# Patient Record
Sex: Female | Born: 1955
Health system: Southern US, Community
[De-identification: ages and names within clinical notes are randomized; demographics above are authoritative.]

---

## 1998-07-26 ENCOUNTER — Ambulatory Visit (HOSPITAL_COMMUNITY): Admission: RE | Admit: 1998-07-26 | Discharge: 1998-07-26 | Payer: Self-pay | Admitting: *Deleted

## 1998-08-15 ENCOUNTER — Ambulatory Visit (HOSPITAL_COMMUNITY): Admission: RE | Admit: 1998-08-15 | Discharge: 1998-08-15 | Payer: Self-pay | Admitting: *Deleted

## 1998-09-07 ENCOUNTER — Ambulatory Visit (HOSPITAL_COMMUNITY): Admission: RE | Admit: 1998-09-07 | Discharge: 1998-09-07 | Payer: Self-pay | Admitting: *Deleted

## 2000-01-14 ENCOUNTER — Encounter: Payer: Self-pay | Admitting: Emergency Medicine

## 2000-01-14 ENCOUNTER — Emergency Department (HOSPITAL_COMMUNITY): Admission: EM | Admit: 2000-01-14 | Discharge: 2000-01-14 | Payer: Self-pay | Admitting: Emergency Medicine

## 2000-01-16 ENCOUNTER — Encounter: Payer: Self-pay | Admitting: Gastroenterology

## 2000-01-16 ENCOUNTER — Inpatient Hospital Stay (HOSPITAL_COMMUNITY): Admission: EM | Admit: 2000-01-16 | Discharge: 2000-01-21 | Payer: Self-pay | Admitting: Emergency Medicine

## 2000-05-29 ENCOUNTER — Other Ambulatory Visit: Admission: RE | Admit: 2000-05-29 | Discharge: 2000-05-29 | Payer: Self-pay | Admitting: Obstetrics and Gynecology

## 2000-06-05 ENCOUNTER — Encounter: Admission: RE | Admit: 2000-06-05 | Discharge: 2000-06-05 | Payer: Self-pay | Admitting: Obstetrics and Gynecology

## 2000-06-05 ENCOUNTER — Encounter: Payer: Self-pay | Admitting: Obstetrics and Gynecology

## 2001-09-25 ENCOUNTER — Other Ambulatory Visit: Admission: RE | Admit: 2001-09-25 | Discharge: 2001-09-25 | Payer: Self-pay | Admitting: Obstetrics and Gynecology

## 2003-02-09 ENCOUNTER — Other Ambulatory Visit: Admission: RE | Admit: 2003-02-09 | Discharge: 2003-02-09 | Payer: Self-pay | Admitting: Obstetrics and Gynecology

## 2003-10-26 ENCOUNTER — Encounter: Admission: RE | Admit: 2003-10-26 | Discharge: 2003-10-26 | Payer: Self-pay | Admitting: Gastroenterology

## 2004-03-23 ENCOUNTER — Ambulatory Visit (HOSPITAL_COMMUNITY): Admission: RE | Admit: 2004-03-23 | Discharge: 2004-03-23 | Payer: Self-pay | Admitting: Gastroenterology

## 2004-10-19 ENCOUNTER — Other Ambulatory Visit: Admission: RE | Admit: 2004-10-19 | Discharge: 2004-10-19 | Payer: Self-pay | Admitting: Obstetrics and Gynecology

## 2006-04-16 ENCOUNTER — Encounter: Admission: RE | Admit: 2006-04-16 | Discharge: 2006-04-16 | Payer: Self-pay | Admitting: Obstetrics and Gynecology

## 2006-10-26 ENCOUNTER — Emergency Department (HOSPITAL_COMMUNITY): Admission: EM | Admit: 2006-10-26 | Discharge: 2006-10-26 | Payer: Self-pay | Admitting: Emergency Medicine

## 2006-10-27 ENCOUNTER — Inpatient Hospital Stay (HOSPITAL_COMMUNITY): Admission: EM | Admit: 2006-10-27 | Discharge: 2006-10-28 | Payer: Self-pay | Admitting: Emergency Medicine

## 2009-09-20 ENCOUNTER — Ambulatory Visit (HOSPITAL_COMMUNITY): Admission: RE | Admit: 2009-09-20 | Discharge: 2009-09-20 | Payer: Self-pay | Admitting: Gastroenterology

## 2010-02-02 ENCOUNTER — Ambulatory Visit: Payer: Self-pay | Admitting: Vascular Surgery

## 2011-02-23 NOTE — Op Note (Signed)
[facility] Morton. [facility]  Patient:    Kathryn Morton, Kathryn Morton                      MRN: [id number] Proc. Date: 01/16/00 Adm. Date:  [id number] Attending:  Glenna Fellows Morton                           Operative Report  PREOPERATIVE DIAGNOSIS:  Cecal volvulus.  POSTOPERATIVE DIAGNOSIS:  Cecal volvulus.  SURGICAL PROCEDURE:  Right hemicolectomy.  SURGEON:  Kathryn Morton. Hoxworth, M.D.  ASSISTANT:  Kathryn Morton, M.D.  ANESTHESIA:  General.  BRIEF HISTORY:  Kathryn Morton is a 55 year old white female, who presents with two days of severe constant sharp and cramping right mid abdominal pain.  She has had a work-up over the past 36 hours, including a KUB showing a large gas collection n the mid abdomen and then subsequent CT-scan that was felt suspicious for a cecal volvulus.  She underwent colonoscopy with showed a narrowing of the colon in the mid ascending colon, which could not be bypassed.  A gastrograph and enema was hen obtained, which shows a clear cecal volvulus with an abrupt cut off in the mid ascending colon and a large gas bubble in the mid abdomen.  Emergency colectomy has been recommended and accepted.  Kathryn Morton of the procedure, its indications, and risks of bleeding, infection were discussed and understood preoperatively and she is ow brought to the operating room for this procedure.  DESCRIPTION OF PROCEDURE:  The patient was brought to the operating room, placed in the supine position on the operating table and general endotracheal anesthesia as induced.  She had received broad-spectrum antibiotics intravenously.  Foley catheter and nasogastric tube were placed and the abdomen was sterilely prepped and draped.  The abdomen was explored through a midline incision skirting the umbilicus.  On entering the peritoneal cavity, there was some serous fluid that was suctioned. The cecum was massively dilated and congested, but there was  no perforation or necrosis.  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  The cecum measured approximately 15 cm in diameter.  The white line of Toldt along the right colon was incised and using blunt and cautery dissection, the right colon and cecum were delivered up out of the retroperitoneum freeing he obstruction.  We then elected to proceed with resection of the right colon, which was quite redundant.  A point in the proximal transverse colon was chosen with ood blood supply and this area was cleaned of omentum and pericolic fat and mesentery. The mesentery beginning at the extreme terminal ileum was divided working distally along the cecum and right colon.  Large vessels were doubly ligated with 2-0 silk. Therefore, the mesentery was completely divided.  A functional end-to-end anastomosis was created between the terminal ileum and the proximal transverse colon with a single firing of the GIA stapler.  The common enterotomy was closed and the specimen removed with a firing of the TA-60 stapler.  The mesenteric defect was closed with interrupted 3-0 silk sutures.  The anastomosis was widely patent and under no tension.  The abdomen was irrigated with saline and inspected for hemostasis, which was complete.  The remainder of the exploration, including uterus, ovaries, retroperitoneum, small bowel, liver, stomach  and duodenum were all unremarkable.  The fascia was closed with running #1 PDS beginning at the each nd of the incision and tied centrally.  The subcutaneous tissue was irrigated and kin closed with staples.  Sponge, needle and instrument counts were correct.  Dry sterile dressings were applied and the patient taken to recovery room in good condition. DD:  01/16/00 TD:   01/17/00 Job: [id number]

## 2011-02-23 NOTE — Op Note (Signed)
NAME:  Kathryn Morton, Kathryn Morton                         ACCOUNT NO.:  [id number]   MEDICAL RECORD NO.:  [id number]                   PATIENT TYPE:  AMB   LOCATION:  ENDO                                 FACILITY:  [facility]   PHYSICIAN:  Petra Kuba, M.D.                 DATE OF BIRTH:  January 19, 1956   DATE OF PROCEDURE:  03/23/2004  DATE OF DISCHARGE:                                 OPERATIVE REPORT   PROCEDURE:  Colonoscopy.   INDICATIONS:  Patient with multiple GI symptoms and normal x-ray; almost due  for colonic screening.  Consent was signed after risks, benefits, methods  and options were thoroughly discussed in the office.   MEDICATIONS USED:  1. Demerol 100 mg .  2. Versed 10 mg.   DESCRIPTION OF PROCEDURE:  Rectal inspection is pertinent for small external  hemorrhoids.  Digital examination was negative.  Video pediatric adjustable  colonoscope was inserted, and with some difficulty due to a tortuous colon.  which required rolling her onto her back and her right side.  Once we were  able to get the loop out we were then easily able to advance to the  anastomosis and a short way into the small bowel.  She did have a colon and  a small bowel side anastomosis.  No abnormality were seen on insertion.  The  prep was adequate.  There was some liquid stool that required washing and  suctioning.  On slow withdrawal back to the rectum, no abnormalities were  seen.  Anal and rectal pullthrough in retroflexion confirmed some small  hemorrhoids.   The scope was reinserted a short way up the left side of the colon.  Air was  suctioned and scope removed.  The patient tolerated the procedure well.  There was no obvious immediate complication.   ENDOSCOPIC DIAGNOSES:  1. Internal/external hemorrhoids.  2. Colon and the small bowel site anastomosis.  3. Otherwise within normal limits to the terminal ileum.   PLAN:  Repeat screening in five years.  Might even consider a virtual  colonoscopy if  widely available, secondary to tortuosity.  Otherwise  continue on with EGD.                                               Petra Kuba, M.D.    MEM/MEDQ  D:  03/23/2004  T:  03/24/2004  Job:  [id number]   cc:   Thora Lance, M.D.  [address]  [place]  [place] [address]  Fax: [phone]

## 2011-02-23 NOTE — Discharge Summary (Signed)
[facility]  Patient:    Kathryn Morton, Kathryn Morton                      MRN: [id number] Adm. Date:  [id number] Disc. Date: [id number] Attending:  Delsa Bern CC:         Llana Aliment. Randa Evens, M.D.             Alvester Morin, M.D.                           Discharge Summary  OPERATIONS/PROCEDURES: Colonoscopy on 01/16/00 by Dr. Vilinda Boehringer. Right hemicolectomy on 01/16/00 by Dr. Glenna Fellows.  DISCHARGE DIAGNOSIS:  Cecal volvulus.  HISTORY OF PRESENT ILLNESS:  Yeraldine Forney is a 55 year old white female with a  long history of occasional crampy abdominal pain who has been managed for irritable bowel syndrome.  Her symptoms have been gradually worsening and approximately one week ago, she had a much more severe episode of acute right midabdominal pain that resolved.  She then presents this admission with a two-day history of constant,  squeezing cramping right-sided and midabdominal pain.  She is admitted through he emergency department for further evaluation due to persistent pain.  PAST MEDICAL HISTORY:  She carries a diagnosis of IBS.  PAST SURGICAL HISTORY:  She is status post a cholecystectomy, appendectomy and tonsillectomy.  MEDICATIONS:  Levsin p.r.n.  ALLERGIES:  No known drug allergies.  SOCIAL HISTORY:  She is an OR Engineer, civil (consulting) at [employer].  She is married, without children.  Drinks rarely socially and does not smoke cigarettes.  PERTINENT PHYSICAL EXAMINATION:  GENERAL:  She is a thin, healthy-appearing white female.  VITAL SIGNS:  Temperature 99.4, pulse 80, respirations 18 and blood pressure 134/83.  Pertinent findings were limited to the abdomen, which reveals mild distention and moderate tenderness in the right midabdomen and right lower quadrant.  Stool was heme negative.  ADMISSION LABORATORY:  White count 4.3 and hemoglobin 10.4.  Electrolytes and LFTs - normal.   KUB was obtained, which revealed  a large central gas bubble, possibly in the transverse colon.  CT scan of the abdomen and pelvis confirmed this, which was elt possibly to be right colon and there was a small (probably insignificant left ovarian cyst).  On 01/15/00, she felt somewhat better, but was still having some persistent abdominal discomfort.  Dr. Randa Evens was consulted.  She initially was felt to have constipation secondary to IBS.  However, on 01/15/00, the patient developed again severe right-sided abdominal pain, unresponsive to narcotics.  This came on shortly after taking Miralax.  Her pain persisted into 01/16/00 with severe constant pain.  Her x-rays were reviewed by the teaching service and Dr. Randa Evens, and the concern was raised about a possible volvulus of the right colon.  The patient underwent  urgent colonoscopy by Dr. Randa Evens with findings of abrupt narrowing of the ascending colon, consistent with cecal volvulus.  An urgent Gastrografin enema was obtained immediately following this, which was  diagnostic for a cecal volvulus with near total obstruction of her ascending colon and a large central gas bubble representing the cecum.  I was consulted at this point and with persistent pain and tenderness and these  findings, urgent right colectomy was recommended.  The patient was taken to the  operating room on 01/16/00 and findings, indeed, did reveal a cecal volvulus with a markedly dilated and congested cecum.  She  underwent a right colectomy without incident.  Her postoperative course was relatively smooth.  The Foley catheter was discontinued on the second day, and she was started on ice chips at this point.  She had positive bowel sounds on 01/19/00, and was begun on sips of liquids. She had a bowel movement by 01/20/00, and was advanced to a regular diet.  DISPOSITION:  She is felt ready for discharge on the fifth postoperative day on  01/21/00.  Her wound is healing primarily,  and staples were removed.  She is tolerating a diet.  She is up and around with moderate discomfort.  DISCHARGE INSTRUCTIONS:  Activity limitation was reviewed prior to discharge. Followup is to be in my office in 10-14 days. DD:  02/08/00 TD:  02/08/00 Job: [id number]

## 2011-02-23 NOTE — Consult Note (Signed)
[facility]  Patient:    Kathryn Morton, Kathryn Morton                      MRN: [id number] Proc. Date: 01/15/00 Adm. Date:  [id number] Attending:  Phifer, Harriett Sine Welcome CC:         Alvester Morin, M.D.                          Consultation Report  REASON FOR CONSULTATION:  Abdominal pain.  HISTORY:  A 55 year old white female who has a history of irritable bowel that as a clinical diagnosis given to her a number of years ago.  She has persistent abdominal pain and constipation and often goes up to a week without a bowel movement.  She is taking Levsin, which she has obtained from an anesthesiologist in the OR.  Her abdominal pain started about 9 p.m. Saturday after dinner and became so severe she came to the emergency room.  CT of the abdomen showed an ovarian cyst, an enormous amount of stool, a dilated transverse colon.  She was given Percocet.  Labs remarkable for normal H&H and electrolytes.  The patient had a cholecystectomy.  She came in with this severe pain and again the CT showed colon full of stool. She has gotten enemas in the hospital and has had some relief with the enemas.  She has continued to be afebrile.  MEDICATIONS ON ADMISSION:  Levsin p.r.n.  ALLERGIES:  She has no drug reactions.  PAST MEDICAL HISTORY:  History of irritable bowel.  History of previous appendicitis.  Previous cholecystectomy.  FAMILY HISTORY:  Remarkable for colon cancer in her uncle, colon polyps in both  parents and a sister.  SOCIAL HISTORY:  She is married and lives with her husband.  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  She drinks wine and smokes cigars occasionally.  PHYSICAL EXAMINATION:  VITAL SIGNS:  The patient is afebrile.  GENERAL:  Thin, white female in no acute distress.  HEENT:  Sclerae non-icteric.  HEART:  Regular rate and rhythm without murmurs or gallops.  ABDOMEN:  Soft with mild tenderness in the left  lower quadrant.  ASSESSMENT: 1. Abdominal pain--this is probably due to severe constipation.  Although other    possibilities exist, with a CT scan showing an enormous amount of stool this is    the most likely cause and I think we need to go and get her colon cleared out.    Since she does have a strong family history of colon cancer and polyps, I think    a colonoscopy would be more appropriate than a barium enema.  We will work on    this to get done at a later date. 2. Irritable bowel syndrome by history. 3. Status post cholecystectomy and appendectomy.  PLAN:  Plan on starting Miralax 3-4 glasses daily.  Continue with the enemas and, as soon as she begins to have bowel movements and tolerate solid food, I think e can discharge her and follow up further as an outpatient. DD:  01/15/00 TD:  01/15/00 Job: [id number]

## 2011-02-23 NOTE — Op Note (Signed)
NAMETANGANYIKA, BOWLDS NO.:  [id number]   MEDICAL RECORD NO.:  [id number]          PATIENT TYPE:  INP   LOCATION:  2550                         FACILITY:  [facility]   PHYSICIAN:  Dionne Ano. Gramig III, M.D.DATE OF BIRTH:  01-13-1956   DATE OF PROCEDURE:  10/27/2006  DATE OF DISCHARGE:                               OPERATIVE REPORT   PREOPERATIVE DIAGNOSIS:  Complex comminuted interarticular left distal  radius fracture, greater than five pieces with associated carpal tunnel  syndrome and swelling.   POSTOPERATIVE DIAGNOSIS:  Complex comminuted interarticular left distal  radius fracture, greater than five pieces with associated carpal tunnel  syndrome and swelling.   PROCEDURE:  1. Open reduction internal fixation left comminuted complex      interarticular distal radius fracture with DVR plate and screw      construct from [employer] and allograft bone grafting in the      form of Grafton bone graft and cancellous bone chips.  2. Stress radiography.  3. Fasciotomy, left forearm.  4. Carpal tunnel release, open in nature, left upper extremity.   SURGEON:  Dionne Ano. Amanda Pea, M.D.   ASSISTANT:  Karie Chimera, PA   TOURNIQUET TIME:  Less than 90 minutes.   DRAINS:  One.   ESTIMATED BLOOD LOSS:  Less than 50 mL.   COMPLICATIONS:  None.   INDICATIONS FOR PROCEDURE:  Ms. Vandenbos is a 55 year old female presents  with the above-mentioned diagnosis.  I have counseled her in regard to  risks and benefits of surgery including risk of infection, bleeding,  anesthesia, damage to normal structures and failure of surgery to  accomplish its intended goals of relieving symptoms and restoring  function.  With this mind, she desires to proceed.  All questions have  been encouraged and answered preoperatively.   The patient has a large degree of comminution.  She has acute carpal  tunnel symptoms and has had prior carpal tunnel symptoms in the past.  I  have  discussed her the options for treatment etc. and she desires to  proceed the above-mentioned surgical endeavors.  We have extensively  counseled her and performed a preliminary reduction when she presented  last night, October 26, 2006.   OPERATIVE PROCEDURE IN DETAIL:  The patient was seen by myself and  anesthesia.  Extremity to be operated on was marked.  She was given  preoperative Ancef, consented and taken to the operative suite where she  underwent smooth induction of general anesthesia.  Following this, the  patient was laid supine, appropriately padded, prepped and draped in  usual sterile fashion with Betadine scrub and paint about the left upper  extremity.  I held the arm very carefully to avoid manipulation during  this.  In addition to this, the patient was carefully draped after prep  was completed and had finger trap traction applied.  Once this was done,  the tourniquet was insufflated to 250 mmHg and an incision was made  volar radially about the forearm.  Once the incision was made, I then  dissected down, released the FCR tendon sheath both  palmar and distally  and once this was done, slid scissor tips to release the superficial and  then deep fascia.  She has significant muscle bulge and swelling, but no  evidence of necrotic muscle.  Once this was done, I then carefully  placed a retractor and elevated the carpal contents ulnarly.  The  pronator was incised in L-shaped fashion, fracture site was then  accessed.  Once the fasciotomy was performed and fracture was accessed,  attention was turned towards the broken radius which was highly  comminuted, complex, greater than five parts with the bone defect  secondary to metaphyseal bone loss.   The patient had a combination of Grafton and cancellous bone chips  placed in the defect.  I very carefully reassembled the articular  surface paying special attention to the lunate facette.  This was done  to my satisfaction  without difficulty and large amount of bone graft was  placed in the metaphyseal void.  This void was noted preoperatively and  discussed with the patient of course.  Following bone grafting, the  patient then had placement of a DVR plate and screw construct.  This was  applied in standard AO fashion.  The patient had excellent restoration  of her anatomy.  Radial inclination, height and volar tilt looked  excellent.  The plate and screw construct sat nicely without  complicating feature.   Once this was done, I then performed copious irrigation followed by  stress radiography.  Stress radiography was performed throughout the  case to verify correct position of the pegs and screw construct for the  DVR plate.  Anatomic reduction was noted and I was quite pleased with  it, given her degree of comminution.  Once this was complete, I then  repaired the pronator quadratus with 3-0 Vicryl, it covered the plate  beautifully.  It was taken down meticulously during the initial  dissection so as to cover the plate of course.   My interpretation of the x-rays revealed excellent position and no  hardware prominence.  All looked quite well on fluoroscopy views taken  in the OR.   Following this, I then turned attention towards the carpal canal.  The  patient demonstrated carpal tunnel symptoms preop.  Thus a incision 1  inch in nature was made at the distal edge of the transverse carpal  ligament in line with the radial border of the ring finger.  Dissection  was carried down, skin was retracted of harm's way palmar fascia was  incised.  Following this, the distal edge transverse carpal ligament was  accessed, released and the superficial palmar arch was carefully  protected all times.  I released this tissue without difficulty and  following this, performed distal to proximal dissection until room was available for sliding the scissors tips under direct vision to release  the proximal leaflet  and portions of the antebrachial fascia.  This done  without difficulty.  Following this, the tourniquet was deflated.  Hemostasis obtained with bipolar electrocautery and the patient then  underwent copious irrigation.  The carpal tunnel release site was closed  with Prolene.  The volar radial wound was closed with a Vicryl, 3-0  variety followed by Prolene stitches.  She tolerated this well and there  were no complicating features.  TLS drain was placed to allow for the  egress of fluid.  The patient 10 mL of Marcaine without epinephrine  0.25% and 10 mL of Marcaine 0.25% with epinephrine placed in the wound  for postop analgesia.  She tolerated this well and there were no  complicating features.  Once this done, Xeroform followed by Neosporin,  Adaptic gauze and a volar plaster splint was placed without difficulty.  She tolerated this well.  Once this done, she was extubated, taken to  recovery room,  will be monitored, admitted for IV antibiotics general postop measures  etc. I have discussed with her and her family do's and don't's, pre and  postop routines etc.  This was a high comminuted fracture and we will  watch it closely in the postoperative period.           ______________________________  Dionne Ano. Everlene Other, M.D.     Nash Mantis  D:  10/27/2006  T:  10/27/2006  Job:  [id number]

## 2011-02-23 NOTE — Op Note (Signed)
NAMEESTI, DEMELLO NO.:  [id number]   MEDICAL RECORD NO.:  [id number]          PATIENT TYPE:  INP   LOCATION:  2550                         FACILITY:  [facility]   PHYSICIAN:  Dionne Ano. Gramig III, M.D.DATE OF BIRTH:  April 26, 1956   DATE OF PROCEDURE:  10/27/2006  DATE OF DISCHARGE:                               OPERATIVE REPORT   Kathryn Morton was seen.  Full H&P performed, and exam concluded October 26, 2006.   Kathryn Morton has a significant distal radius fracture, comminuted complex  interarticular, greater than five parts.  She presented with an acutely  displaced fracture.  She underwent hematoma block and administration of  2 mg of morphine and 2 mg of Valium IV.  Following this she underwent  closed manipulation restoring parameters.   Postreduction x-rays looked excellent.  She tolerated the closed  reduction quite well.  Given her large defect/metaphyseal void and  propensity towards collapse, we are going to plan for ORIF in the future  with bone grafting, tendon release and concomitant carpal tunnel  release, given her carpal tunnel symptoms and preop examination  findings.  I have discussed with Kathryn Morton these issues and the risks and  benefits of surgery including risk of infection, bleeding, anesthesia,  damage to normal structures and failure of surgery to accomplish its  intended goals of relieving symptoms and restoring function.  With this  in mind, she desires to proceed.  We will proceed __________ time.  She  left the emergency room stable on Percocet, Robaxin and Peri-Colace per  my instruction.  All questions have been encouraged and answered and  addressed.           ______________________________  Dionne Ano. Everlene Other, M.D.     Nash Mantis  D:  10/27/2006  T:  10/27/2006  Job:  [id number]

## 2011-02-23 NOTE — Procedures (Signed)
[facility]  Patient:    Kathryn Morton, Kathryn Morton                      MRN: [id number] Proc. Date: 01/07/00 Adm. Date:  [id number] Attending:  Delsa Bern CC:         Alvester Morin, M.D.             Lorne Skeens. Hoxworth, M.D.                           Procedure Report  PROCEDURE PERFORMED:  Colonoscopy.  ENDOSCOPIST:  Llana Aliment. Randa Evens, M.D.  MEDICATIONS USED:  Fentanyl 75 mcg, Versed 7 mg IV.  INSTRUMENT:  Pediatric video colonoscope.  INDICATIONS:  Patient is a 55 year old woman who does have a family history of colon polyps who came with abdominal pain and "constipation."  CT showed dilated colon, a lot of fecal type material.  She has not responded to enemas or laxatives.  In fact, it has become worse with increasing abdominal pain. She has had bouts of abdominal pain for some time treated with p.r.n. Levsin and n.p.o. status which would eventually resolve it.  This was a similar episode, only much worse.  Review of a set of acute abdominal films today and the previous CT with a radiologist led to a probable diagnosis of a cecal volvulus.  The colon was quite dilated.  She was having increasing distention this morning.  In view of this, a colonoscopy was performed to try to get a better handle on the diagnosis and to decompress the colon.  DESCRIPTION OF PROCEDURE:  The procedure had been explained to the patient and consent obtained.  With the patient in the left lateral decubitus position, the Olympus pediatric video colonoscope was inserted and advanced under direct visualization.  The patient had a very clean colon after all the enemas and laxatives that she had.  In fact, there was very little solid stool.  We were able to reach what appeared to be the transverse colon.  It was a narrowed area.  With gentle pressure, we entered into this and there was immediate decompression of her belly.  There was a large dilated colon on the  other side.  There was a fair amount of liquid stool.  All this was suctioned out and decompressed.  We advanced down into the right lower quadrant, right mid colon which was felt to be in the ascending colon and bird beak type area was encountered.  I was unable to pass this.  Had the impression of obstruction somewhere in the ascending colon.  I was afraid to pass the scope through not really knowing what was on the other side.  The scope was withdrawn and decompressed completely upon withdrawal.  The patient tolerated this very well and her abdomen was much less distended.  The patient was resting comfortably at the termination of the procedure.  ASSESSMENT:  Apparent blockage of the ascending colon.  I think this is all consistent with a cecal volvulus.  The entire colon has been decompressed up to this point.  PLAN:  Will move ahead with a Gastrografin enema to try to define the anatomy and had asked Dr. Johna Sheriff to see her in consultation. DD:  01/16/00 TD:  01/17/00 Job: [id number]

## 2011-02-23 NOTE — Op Note (Signed)
NAME:  Kathryn Morton, Kathryn Morton                         ACCOUNT NO.:  [id number]   MEDICAL RECORD NO.:  [id number]                   PATIENT TYPE:  AMB   LOCATION:  ENDO                                 FACILITY:  [facility]   PHYSICIAN:  Petra Kuba, M.D.                 DATE OF BIRTH:  1956/04/08   DATE OF PROCEDURE:  03/23/2004  DATE OF DISCHARGE:                                 OPERATIVE REPORT   PROCEDURE:  EGD .   INDICATIONS:  Upper tract symptoms.  Abnormal upper GI.  Consent was signed  after risks, benefits, methods and options were thoroughly discussed in the  office.   MEDICATIONS USED:  No additional medications were used for this procedure.   DESCRIPTION OF PROCEDURE:  The video endoscope was inserted by direct  vision.  The esophagus, proximal and mid esophagus was normal.  In the  distal esophagus there was a small hiatal hernia, with a widely patent ring  and some minimal distal inflammation.  The scope passed easily into the  stomach and advanced through normal antrum, normal pylorus into a normal  duodenal bulb; around the celiac to a normal second portion of the duodenum.  The scope was withdrawn back to the bulb and a good look there ruled out  abnormalities and mallocutions.  The stomach was evaluated on retroflex and  with straight visualization, with a good look at the cardia, fundus,  annularis, lesser and greater curve.  On retroflexion the hiatal hernia was  confirmed, but no other abnormality was seen on straight and retroflexed  visualization.  The air was suctioned and the scope was slowly withdrawn  again. A good look at the hiatal hernia pouch and the esophagus confirmed  the above findings.  The scope was removed.  The patient tolerated the  procedure well and there was no evidence of any complications.   ENDOSCOPIC DIAGNOSES:  1. Small hiatal hernia with a widely patent ring and minimal distal     inflammation.  2. Otherwise normal EGD.   PLAN:  Trial of  pump inhibitors for two months, and then follow-up.  Happy  to see back sooner p.r.n. Will write prescription for Nexium.                                               Petra Kuba, M.D.    MEM/MEDQ  D:  03/23/2004  T:  03/24/2004  Job:  [id number]   cc:   Thora Lance, M.D.  [address]  [place]  [place] [address]  Fax: [phone]

## 2012-07-16 ENCOUNTER — Ambulatory Visit
Admission: RE | Admit: 2012-07-16 | Discharge: 2012-07-16 | Disposition: A | Discharge status: Home / Self Care | Payer: 59 | Source: Ambulatory Visit | Attending: Gastroenterology | Admitting: Gastroenterology

## 2012-07-16 ENCOUNTER — Other Ambulatory Visit: Payer: Self-pay | Admitting: Gastroenterology

## 2012-07-16 DIAGNOSIS — R05 Cough: Secondary | ICD-10-CM

## 2012-07-16 DIAGNOSIS — R059 Cough, unspecified: Secondary | ICD-10-CM

## 2015-10-25 MED FILL — CITALOPRAM HBR 10 MG TABLET: 10 | 30 days supply | Qty: 30 | Fill #1

## 2015-11-30 DIAGNOSIS — G5762 Lesion of plantar nerve, left lower limb: Secondary | ICD-10-CM | POA: Diagnosis not present

## 2015-11-30 DIAGNOSIS — G5761 Lesion of plantar nerve, right lower limb: Secondary | ICD-10-CM | POA: Diagnosis not present

## 2015-12-05 MED FILL — CITALOPRAM HBR 10 MG TABLET: 10 | 30 days supply | Qty: 30 | Fill #0

## 2016-01-09 MED FILL — CITALOPRAM HBR 10 MG TABLET: 10 | 30 days supply | Qty: 30 | Fill #1

## 2016-01-30 MED FILL — PANTOPRAZOLE SOD DR 40 MG T: 40 | 90 days supply | Qty: 180 | Fill #1

## 2016-02-08 MED FILL — CITALOPRAM HBR 10 MG TABLET: 10 | 30 days supply | Qty: 30 | Fill #2

## 2016-02-09 MED FILL — ATOVAQUONE-PROGUANIL 250-10: 250-100 | 17 days supply | Qty: 17 | Fill #0

## 2016-02-09 MED FILL — ZOLPIDEM TARTRATE 10 MG TAB: 10 | 15 days supply | Qty: 15 | Fill #0

## 2016-02-09 MED FILL — ONDANSETRON HCL 4 MG TABLET: 4 | 4 days supply | Qty: 15 | Fill #0

## 2016-02-09 MED FILL — CIPROFLOXACIN HCL 500 MG TA: 500 | 3 days supply | Qty: 6 | Fill #0

## 2016-02-10 MED FILL — OSCIMIN SL 0.125 MG TABLET: 0.125 | 8 days supply | Qty: 100 | Fill #0

## 2016-03-16 MED FILL — CITALOPRAM HBR 10 MG TABLET: 10 | 30 days supply | Qty: 30 | Fill #0

## 2016-05-24 DIAGNOSIS — Z1231 Encounter for screening mammogram for malignant neoplasm of breast: Secondary | ICD-10-CM | POA: Diagnosis not present

## 2016-05-24 DIAGNOSIS — Z13820 Encounter for screening for osteoporosis: Secondary | ICD-10-CM | POA: Diagnosis not present

## 2016-05-24 DIAGNOSIS — Z01419 Encounter for gynecological examination (general) (routine) without abnormal findings: Secondary | ICD-10-CM | POA: Diagnosis not present

## 2016-05-24 DIAGNOSIS — Z6823 Body mass index (BMI) 23.0-23.9, adult: Secondary | ICD-10-CM | POA: Diagnosis not present

## 2016-05-25 ENCOUNTER — Other Ambulatory Visit (HOSPITAL_COMMUNITY): Payer: Self-pay | Admitting: Obstetrics and Gynecology

## 2016-05-25 ENCOUNTER — Ambulatory Visit (HOSPITAL_COMMUNITY)
Admission: RE | Admit: 2016-05-25 | Discharge: 2016-05-25 | Disposition: A | Discharge status: Home / Self Care | Payer: 59 | Source: Ambulatory Visit | Attending: Obstetrics and Gynecology | Admitting: Obstetrics and Gynecology

## 2016-05-25 DIAGNOSIS — M79661 Pain in right lower leg: Secondary | ICD-10-CM | POA: Insufficient documentation

## 2016-05-25 DIAGNOSIS — R52 Pain, unspecified: Secondary | ICD-10-CM

## 2016-05-25 NOTE — Progress Notes (Signed)
*  Preliminary Results* Right lower extremity venous duplex completed. Right lower extremity is negative for deep vein thrombosis. There is no evidence of right Baker's cyst.  05/25/2016 9:54 AM  Gertie FeyMichelle Keyry Iracheta, BS, RVT, RDCS, RDMS

## 2016-07-10 MED FILL — CITALOPRAM HBR 10 MG TABLET: 10 | 90 days supply | Qty: 90 | Fill #0

## 2016-08-15 MED FILL — PANTOPRAZOLE SOD DR 40 MG T: 40 | 30 days supply | Qty: 30 | Fill #0

## 2016-09-14 DIAGNOSIS — M62830 Muscle spasm of back: Secondary | ICD-10-CM | POA: Diagnosis not present

## 2016-09-14 DIAGNOSIS — K219 Gastro-esophageal reflux disease without esophagitis: Secondary | ICD-10-CM | POA: Diagnosis not present

## 2016-09-14 MED FILL — METHOCARBAMOL 500 MG TABLET: 500 | 5 days supply | Qty: 30 | Fill #0

## 2016-09-14 MED FILL — PANTOPRAZOLE SOD DR 40 MG T: 40 | 90 days supply | Qty: 90 | Fill #0

## 2016-09-27 ENCOUNTER — Encounter (INDEPENDENT_AMBULATORY_CARE_PROVIDER_SITE_OTHER): Payer: Self-pay | Admitting: Orthopedic Surgery

## 2016-09-27 ENCOUNTER — Ambulatory Visit (INDEPENDENT_AMBULATORY_CARE_PROVIDER_SITE_OTHER): Payer: Self-pay | Admitting: Orthopedic Surgery

## 2016-09-27 ENCOUNTER — Ambulatory Visit (INDEPENDENT_AMBULATORY_CARE_PROVIDER_SITE_OTHER): Payer: 59 | Admitting: Orthopedic Surgery

## 2016-09-27 VITALS — Ht 64.0 in | Wt 135.0 lb

## 2016-09-27 DIAGNOSIS — G5762 Lesion of plantar nerve, left lower limb: Secondary | ICD-10-CM

## 2016-09-27 DIAGNOSIS — G5761 Lesion of plantar nerve, right lower limb: Secondary | ICD-10-CM | POA: Diagnosis not present

## 2016-09-27 MED ORDER — METHYLPREDNISOLONE ACETATE 40 MG/ML IJ SUSP
40.0000 mg | INTRAMUSCULAR | Status: AC | PRN
  Administered 2016-09-27: 40 mg via INTRA_ARTICULAR

## 2016-09-27 MED ORDER — LIDOCAINE HCL 1 % IJ SOLN
1.0000 mL | INTRAMUSCULAR | Status: AC | PRN
  Administered 2016-09-27: 1 mL

## 2016-09-27 NOTE — Progress Notes (Signed)
Office Visit Note   Patient: Kathryn Morton           Date of Birth: 10/11/1955           MRN: [id number] Visit Date: 09/27/2016              Requested by: No referring provider defined for this encounter. PCP: Lillia MountainGRIFFIN,JOHN JOSEPH, MD   Assessment & Plan: Visit Diagnoses:  1. Morton's metatarsalgia, neuralgia, or neuroma, left   2. Morton's metatarsalgia, neuralgia, or neuroma, right     Plan: Morton's neuroma injected bilaterally she tolerated this well follow-up as needed.  Follow-Up Instructions: Return if symptoms worsen or fail to improve.   Orders:  No orders of the defined types were placed in this encounter.  No orders of the defined types were placed in this encounter.     Procedures: Small Joint Inj Date/Time: 09/27/2016 3:18 PM Performed by: DUDA, MARCUS V Authorized by: Nadara MustardUDA, MARCUS V   Consent Given by:  Patient Site marked: the procedure site was marked   Timeout: prior to procedure the correct patient, procedure, and site was verified   Indications:  Pain and diagnostic evaluation Location:  Foot Site:  R intertarsal Prep: patient was prepped and draped in usual sterile fashion   Needle Size:  22 G Spinal Needle: No   Approach:  Dorsal Ultrasound Guided: No   Fluoroscopic Guidance: No   Medications:  1 mL lidocaine 1 %; 40 mg methylPREDNISolone acetate 40 MG/ML Aspiration Attempted: No   Patient tolerance:  Patient tolerated the procedure well with no immediate complications Small Joint Inj Date/Time: 09/27/2016 3:19 PM Performed by: DUDA, MARCUS V Authorized by: Nadara MustardUDA, MARCUS V   Consent Given by:  Patient Site marked: the procedure site was marked   Timeout: prior to procedure the correct patient, procedure, and site was verified   Indications:  Pain and diagnostic evaluation Location:  Foot Site:  L intertarsal Prep: patient was prepped and draped in usual sterile fashion   Needle Size:  22 G Spinal Needle: No   Approach:   Dorsal Ultrasound Guided: No   Fluoroscopic Guidance: No   Medications:  1 mL lidocaine 1 %; 40 mg methylPREDNISolone acetate 40 MG/ML Aspiration Attempted: No   Patient tolerance:  Patient tolerated the procedure well with no immediate complications     Clinical Data: No additional findings.   Subjective: Chief Complaint  Patient presents with  . Left Foot - Pain  . Right Foot - Pain    Pt had bilateral morton's neuroma injections 11/30/15. In the office today wanting to have repeat injections. They worked well for her but the pain has returned and would like to have this done today.    Review of Systems   Objective: Vital Signs: Ht 5\' 4"  (1.626 m)   Wt 135 lb (61.2 kg)   BMI 23.17 kg/m   Physical Exam on examination patient is alert oriented no adenopathy well-dressed normal affect normal rest or effort she has a normal gait. She has good pulses bilaterally. There is a wider space between the third and fourth toes bilaterally she has a palpable clunk with lateral compression of the metatarsal heads palpation in the third webspace reproduces her burning pain.  Ortho Exam  Specialty Comments:  No specialty comments available.  Imaging: No results found.   PMFS History: Patient Active Problem List   Diagnosis Date Noted  . Morton's metatarsalgia, neuralgia, or neuroma, left 09/27/2016  . Morton's metatarsalgia, neuralgia, or  neuroma, right 09/27/2016   No past medical history on file.  No family history on file.  No past surgical history on file. Social History   Occupational History  . Not on file.   Social History Main Topics  . Smoking status: Not on file  . Smokeless tobacco: Never Used  . Alcohol use Not on file  . Drug use: Unknown  . Sexual activity: Not on file

## 2016-10-16 MED FILL — CITALOPRAM HBR 10 MG TABLET: 10 | 90 days supply | Qty: 90 | Fill #1

## 2016-11-22 MED FILL — CITALOPRAM HBR 20 MG TABLET: 20 | 90 days supply | Qty: 90 | Fill #0

## 2016-12-19 MED FILL — PANTOPRAZOLE SOD DR 40 MG T: 40 | 90 days supply | Qty: 90 | Fill #1

## 2017-03-18 MED FILL — CITALOPRAM HBR 20 MG TABLET: 20 | 90 days supply | Qty: 90 | Fill #1

## 2017-03-18 MED FILL — PANTOPRAZOLE SOD DR 40 MG T: 40 | 90 days supply | Qty: 90 | Fill #2

## 2017-05-29 DIAGNOSIS — Z1231 Encounter for screening mammogram for malignant neoplasm of breast: Secondary | ICD-10-CM | POA: Diagnosis not present

## 2017-05-29 DIAGNOSIS — Z6823 Body mass index (BMI) 23.0-23.9, adult: Secondary | ICD-10-CM | POA: Diagnosis not present

## 2017-05-29 DIAGNOSIS — Z01419 Encounter for gynecological examination (general) (routine) without abnormal findings: Secondary | ICD-10-CM | POA: Diagnosis not present

## 2017-06-17 MED FILL — CITALOPRAM HBR 20 MG TABLET: 20 | 90 days supply | Qty: 90 | Fill #2

## 2017-06-17 MED FILL — PANTOPRAZOLE SOD DR 40 MG T: 40 | 90 days supply | Qty: 90 | Fill #3

## 2017-08-08 ENCOUNTER — Ambulatory Visit (INDEPENDENT_AMBULATORY_CARE_PROVIDER_SITE_OTHER): Payer: 59 | Admitting: Orthopedic Surgery

## 2017-08-08 ENCOUNTER — Encounter (INDEPENDENT_AMBULATORY_CARE_PROVIDER_SITE_OTHER): Payer: Self-pay | Admitting: Orthopedic Surgery

## 2017-08-08 VITALS — Ht 64.0 in | Wt 135.0 lb

## 2017-08-08 DIAGNOSIS — M722 Plantar fascial fibromatosis: Secondary | ICD-10-CM | POA: Diagnosis not present

## 2017-08-08 DIAGNOSIS — G5762 Lesion of plantar nerve, left lower limb: Secondary | ICD-10-CM

## 2017-08-08 NOTE — Progress Notes (Signed)
   Office Visit Note   Patient: Kathryn Morton           Date of Birth: [birth year]           MRN: [id number] Visit Date: 08/08/2017              Requested by: Kirby FunkGriffin, John, MD [address] [place], [place] [address] PCP: Kirby FunkGriffin, John, MD  Chief Complaint  Patient presents with  . Left Foot - Follow-up    Bilateral morton's neuroma injections 11/30/15  . 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  She states she has been taking Motrin once a day.  Assessment & Plan: Visit Diagnoses:  1. Morton's metatarsalgia, neuralgia, or neuroma, left   2. Plantar fasciitis, left     Plan: Discussed that she can take ibuprofen up to 600 mg 3 times a day with meals.  Recommended a stiff soled Trail running sneaker with sole orthotics.  Discussed that the arch support will spread the metatarsal heads and further unload pressure on the Morton's neuroma.  She was given instructions for heel cord stretching for her plantar fasciitis.  Follow-Up Instructions: Return if symptoms worsen or fail to improve.   Ortho Exam  Patient is alert, oriented, no adenopathy, well-dressed, normal affect, normal respiratory effort. Examination she has a normal gait.  She has good range of motion of both ankles and subtalar joint good pulses she has dorsiflexion 20 degrees past neutral.  She has no tenderness to palpation with compression of the calcaneus the tarsal tunnel is nontender to palpation.  She is point tender to palpation over the origin of the plantar fascia in the left but this is minimal.  There are no nodule changes to the plantar fascia.  There are no nodule changes to the Achilles tendon the Achilles is nontender to palpation.  Imaging: No  results found. No images are attached to the encounter.  Labs: No results found for: HGBA1C, ESRSEDRATE, CRP, LABURIC, REPTSTATUS, GRAMSTAIN, CULT, LABORGA  Orders:  No orders of the defined types were placed in this encounter.  No orders of the defined types were placed in this encounter.    Procedures: No procedures performed  Clinical Data: No additional findings.  ROS:  All other systems negative, except as noted in the HPI. Review of Systems  Objective: Vital Signs: Ht 5\' 4"  (1.626 m)   Wt 135 lb (61.2 kg)   BMI 23.17 kg/m   Specialty Comments:  No specialty comments available.  PMFS History: Patient Active Problem List   Diagnosis Date Noted  . Plantar fasciitis, left 08/08/2017  . Morton's metatarsalgia, neuralgia, or neuroma, left 09/27/2016  . Morton's metatarsalgia, neuralgia, or neuroma, right 09/27/2016   No past medical history on file.  No family history on file.  No past surgical history on file. Social History   Occupational History  . Not on file.   Social History Main Topics  . Smoking status: Unknown If Ever Smoked  . Smokeless tobacco: Never Used  . Alcohol use Not on file  . Drug use: No  . Sexual activity: Not on file

## 2017-08-26 DIAGNOSIS — F329 Major depressive disorder, single episode, unspecified: Secondary | ICD-10-CM | POA: Diagnosis not present

## 2017-08-26 DIAGNOSIS — K219 Gastro-esophageal reflux disease without esophagitis: Secondary | ICD-10-CM | POA: Diagnosis not present

## 2017-08-26 DIAGNOSIS — Z0000 Encounter for general adult medical examination without abnormal findings: Secondary | ICD-10-CM | POA: Diagnosis not present

## 2017-08-26 DIAGNOSIS — K581 Irritable bowel syndrome with constipation: Secondary | ICD-10-CM | POA: Diagnosis not present

## 2017-09-17 MED FILL — CITALOPRAM HBR 20 MG TABLET: 20 | 90 days supply | Qty: 90 | Fill #3

## 2017-09-25 MED FILL — PANTOPRAZOLE SOD DR 40 MG T: 40 | 90 days supply | Qty: 90 | Fill #0

## 2017-10-04 DIAGNOSIS — H524 Presbyopia: Secondary | ICD-10-CM | POA: Diagnosis not present

## 2017-10-04 DIAGNOSIS — H5213 Myopia, bilateral: Secondary | ICD-10-CM | POA: Diagnosis not present

## 2017-12-18 MED FILL — CITALOPRAM HBR 20 MG TABLET: 20 | 90 days supply | Qty: 90 | Fill #0

## 2017-12-18 MED FILL — OSCIMIN SL 0.125 MG TABLET: 0.125 | 8 days supply | Qty: 100 | Fill #0

## 2017-12-24 MED FILL — PANTOPRAZOLE SOD DR 40 MG T: 40 | 90 days supply | Qty: 90 | Fill #1

## 2018-01-09 MED FILL — OSCIMIN SL 0.125 MG TABLET: 0.125 | 8 days supply | Qty: 100 | Fill #1

## 2018-01-09 MED FILL — CITALOPRAM HBR 20 MG TABLET: 20 | 90 days supply | Qty: 90 | Fill #1

## 2018-01-09 MED FILL — PANTOPRAZOLE SOD DR 40 MG T: 40 | 90 days supply | Qty: 90 | Fill #2

## 2018-05-01 DIAGNOSIS — M722 Plantar fascial fibromatosis: Secondary | ICD-10-CM | POA: Diagnosis not present

## 2018-05-01 MED FILL — MELOXICAM 15 MG TABLET: 15 | 30 days supply | Qty: 30 | Fill #0

## 2018-06-03 DIAGNOSIS — Z1231 Encounter for screening mammogram for malignant neoplasm of breast: Secondary | ICD-10-CM | POA: Diagnosis not present

## 2018-06-03 DIAGNOSIS — Z6823 Body mass index (BMI) 23.0-23.9, adult: Secondary | ICD-10-CM | POA: Diagnosis not present

## 2018-06-03 DIAGNOSIS — Z13820 Encounter for screening for osteoporosis: Secondary | ICD-10-CM | POA: Diagnosis not present

## 2018-06-03 DIAGNOSIS — Z01419 Encounter for gynecological examination (general) (routine) without abnormal findings: Secondary | ICD-10-CM | POA: Diagnosis not present

## 2018-06-25 MED FILL — CITALOPRAM HBR 20 MG TABLET: 20 | 90 days supply | Qty: 90 | Fill #0

## 2018-07-30 DIAGNOSIS — M818 Other osteoporosis without current pathological fracture: Secondary | ICD-10-CM | POA: Diagnosis not present

## 2018-08-20 DIAGNOSIS — R799 Abnormal finding of blood chemistry, unspecified: Secondary | ICD-10-CM | POA: Diagnosis not present

## 2018-09-10 ENCOUNTER — Ambulatory Visit (INDEPENDENT_AMBULATORY_CARE_PROVIDER_SITE_OTHER): Payer: Self-pay | Admitting: Nurse Practitioner

## 2018-09-10 VITALS — BP 120/75 | HR 60 | Temp 98.2°F | Resp 16 | Wt 138.2 lb

## 2018-09-10 DIAGNOSIS — R300 Dysuria: Secondary | ICD-10-CM

## 2018-09-10 DIAGNOSIS — N3000 Acute cystitis without hematuria: Secondary | ICD-10-CM

## 2018-09-10 LAB — POCT URINALYSIS DIPSTICK
BILIRUBIN UA: NEGATIVE
Glucose, UA: NEGATIVE
KETONES UA: NEGATIVE
Nitrite, UA: NEGATIVE
Protein, UA: NEGATIVE
SPEC GRAV UA: 1.015 (ref 1.010–1.025)
UROBILINOGEN UA: 0.2 U/dL
pH, UA: 6.5 (ref 5.0–8.0)

## 2018-09-10 MED ORDER — PHENAZOPYRIDINE HCL 100 MG PO TABS: 100.0000 mg | ORAL_TABLET | Freq: Three times a day (TID) | ORAL | 0 refills | Status: AC | PRN

## 2018-09-10 MED ORDER — NITROFURANTOIN MONOHYD MACRO 100 MG PO CAPS: 100.0000 mg | ORAL_CAPSULE | Freq: Two times a day (BID) | ORAL | 0 refills | Status: AC

## 2018-09-10 NOTE — Patient Instructions (Signed)
Urinary Tract Infection, Adult -Take medication as prescribed. -Ibuprofen or Tylenol for pain, fever, or general discomfort. -Develop a toileting schedule to void at least every 2 hours. -Avoid caffeine, to include tea, soda, and coffee. -Void approximately 15 to 20 minutes after sexual intercourse. -Increase fluids. -Follow-up with your PCP if symptoms do not improve.  At that time you may likely need a urine culture. -Follow-up in our office as needed. A urinary tract infection (UTI) is an infection of any part of the urinary tract, which includes the kidneys, ureters, bladder, and urethra. These organs make, store, and get rid of urine in the body. UTI can be a bladder infection (cystitis) or kidney infection (pyelonephritis). What are the causes? This infection may be caused by fungi, viruses, or bacteria. Bacteria are the most common cause of UTIs. This condition can also be caused by repeated incomplete emptying of the bladder during urination. What increases the risk? This condition is more likely to develop if:  You ignore your need to urinate or hold urine for long periods of time.  You do not empty your bladder completely during urination.  You wipe back to front after urinating or having a bowel movement, if you are female.  You are uncircumcised, if you are female.  You are constipated.  You have a urinary catheter that stays in place (indwelling).  You have a weak defense (immune) system.  You have a medical condition that affects your bowels, kidneys, or bladder.  You have diabetes.  You take antibiotic medicines frequently or for long periods of time, and the antibiotics no longer work well against certain types of infections (antibiotic resistance).  You take medicines that irritate your urinary tract.  You are exposed to chemicals that irritate your urinary tract.  You are female.  What are the signs or symptoms? Symptoms of this condition  include:  Fever.  Frequent urination or passing small amounts of urine frequently.  Needing to urinate urgently.  Pain or burning with urination.  Urine that smells bad or unusual.  Cloudy urine.  Pain in the lower abdomen or back.  Trouble urinating.  Blood in the urine.  Vomiting or being less hungry than normal.  Diarrhea or abdominal pain.  Vaginal discharge, if you are female.  How is this diagnosed? This condition is diagnosed with a medical history and physical exam. You will also need to provide a urine sample to test your urine. Other tests may be done, including:  Blood tests.  Sexually transmitted disease (STD) testing.  If you have had more than one UTI, a cystoscopy or imaging studies may be done to determine the cause of the infections. How is this treated? Treatment for this condition often includes a combination of two or more of the following:  Antibiotic medicine.  Other medicines to treat less common causes of UTI.  Over-the-counter medicines to treat pain.  Drinking enough water to stay hydrated.  Follow these instructions at home:  Take over-the-counter and prescription medicines only as told by your health care provider.  If you were prescribed an antibiotic, take it as told by your health care provider. Do not stop taking the antibiotic even if you start to feel better.  Avoid alcohol, caffeine, tea, and carbonated beverages. They can irritate your bladder.  Drink enough fluid to keep your urine clear or pale yellow.  Keep all follow-up visits as told by your health care provider. This is important.  Make sure to: ? Empty your bladder  often and completely. Do not hold urine for long periods of time. ? Empty your bladder before and after sex. ? Wipe from front to back after a bowel movement if you are female. Use each tissue one time when you wipe. Contact a health care provider if:  You have back pain.  You have a fever.  You  feel nauseous or vomit.  Your symptoms do not get better after 3 days.  Your symptoms go away and then return. Get help right away if:  You have severe back pain or lower abdominal pain.  You are vomiting and cannot keep down any medicines or water. This information is not intended to replace advice given to you by your health care provider. Make sure you discuss any questions you have with your health care provider. Document Released: 07/04/2005 Document Revised: 03/07/2016 Document Reviewed: 08/15/2015 Elsevier Interactive Patient Education  [employer].

## 2018-09-10 NOTE — Progress Notes (Signed)
Subjective:    Kathryn Morton is a 62 y.o. female who complains of abnormal smelling urine, dysuria, nausea, nocturia, suprapubic pressure and urgency for 1 day. Patient denies back pain, fever, stomach ache and vaginal discharge.  Patient does not have a history of recurrent UTI.  Patient does not have a history of pyelonephritis.  The patient informs her first UTI was 1 year ago.     The following portions of the patient's history were reviewed and updated as appropriate: allergies, current medications and past medical history.   Review of Systems Constitutional: negative Ears, nose, mouth, throat, and face: negative Respiratory: negative Cardiovascular: negative Gastrointestinal: positive for nausea, negative for abdominal pain, constipation, diarrhea and vomiting Genitourinary:positive for See HPI, negative for vaginal discharge, decreased stream, hematuria and urinary incontinence Neurological: negative    Objective:    BP 120/75 (BP Location: Right Arm, Patient Position: Sitting, Cuff Size: Normal)   Pulse 60   Temp 98.2 F (36.8 C) (Oral)   Resp 16   Wt 138 lb 3.2 oz (62.7 kg)   SpO2 98%   BMI 23.72 kg/m  General: alert, cooperative and no distress  Abdomen: soft, non-tender, without masses or organomegaly in the entire abdomen  Back: CVA tenderness absent  GU: defer exam   Laboratory:  Urine dipstick shows sp gravity 1.015, negative for glucose, moderate for hemoglobin, negative for ketones, small for leukocyte esterase, negative for nitrites, negative for protein and 0.2 for urobilinogen.   Micro exam: not done.    Assessment:    Acute cystitis    Plan:   Exam findings, diagnosis etiology and medication use and indications reviewed with patient. Follow- Up and discharge instructions provided. No emergent/urgent issues found on exam. Patient education was provided.  This appears to be a simple acute cystitis due to no proteinuria, back pain, abdominal pain, CVA  tenderness, or fever to indicate pyelonephritis.  Patient verbalized understanding of information provided and agrees with plan of care (POC), all questions answered. The patient is advised to call or return to clinic if condition does not see an improvement in symptoms, or to seek the care of the closest emergency department if condition worsens with the above plan.   1. Dysuria  - POCT urinalysis dipstick  2. Acute cystitis without hematuria  - nitrofurantoin, macrocrystal-monohydrate, (MACROBID) 100 MG capsule; Take 1 capsule (100 mg total) by mouth 2 (two) times daily for 5 days.  Dispense: 10 capsule; Refill: 0 - phenazopyridine (PYRIDIUM) 100 MG tablet; Take 1 tablet (100 mg total) by mouth 3 (three) times daily as needed for up to 3 days for pain.  Dispense: 10 tablet; Refill: 0 -Take medication as prescribed. -Ibuprofen or Tylenol for pain, fever, or general discomfort. -Develop a toileting schedule to void at least every 2 hours. -Avoid caffeine, to include tea, soda, and coffee. -Void approximately 15 to 20 minutes after sexual intercourse. -Increase fluids. -Follow-up with your PCP if symptoms do not improve.  At that time you may likely need a urine culture. -Follow-up in our office as needed.

## 2018-09-17 ENCOUNTER — Encounter: Payer: Self-pay | Admitting: Family Medicine

## 2018-09-17 ENCOUNTER — Ambulatory Visit (INDEPENDENT_AMBULATORY_CARE_PROVIDER_SITE_OTHER): Payer: Self-pay | Admitting: Family Medicine

## 2018-09-17 DIAGNOSIS — R3915 Urgency of urination: Secondary | ICD-10-CM

## 2018-09-17 DIAGNOSIS — T3695XA Adverse effect of unspecified systemic antibiotic, initial encounter: Secondary | ICD-10-CM

## 2018-09-17 DIAGNOSIS — N390 Urinary tract infection, site not specified: Secondary | ICD-10-CM

## 2018-09-17 DIAGNOSIS — B379 Candidiasis, unspecified: Secondary | ICD-10-CM

## 2018-09-17 LAB — POC URINALSYSI DIPSTICK (AUTOMATED)
GLUCOSE UA: NEGATIVE
Ketones, UA: NEGATIVE
Leukocytes, UA: NEGATIVE
Nitrite, UA: NEGATIVE
Protein, UA: NEGATIVE
RBC UA: NEGATIVE
SPEC GRAV UA: 1.02 (ref 1.010–1.025)
Urobilinogen, UA: 0.2 E.U./dL
pH, UA: 5 (ref 5.0–8.0)

## 2018-09-17 MED ORDER — NITROFURANTOIN MONOHYD MACRO 100 MG PO CAPS: 100.0000 mg | ORAL_CAPSULE | Freq: Two times a day (BID) | ORAL | 0 refills | Status: AC

## 2018-09-17 MED ORDER — FLUCONAZOLE 150 MG PO TABS: 150.0000 mg | ORAL_TABLET | Freq: Once | ORAL | 0 refills | Status: AC

## 2018-09-17 NOTE — Patient Instructions (Signed)

## 2018-09-17 NOTE — Progress Notes (Signed)
Kathryn Morton is a 62 y.o. female who presents today with concerns of continued urinary pressure and frequency after being treated for UTI with a 5 course of Macrobid and pyridium x 2 days. She reports that she completed the course but still feels symptomatic 2 days after completion of antibiotic. She denies any vaginal discharge or odor or risk factors for STI/STD. Patients primary concern is that she is going to be out of the country for the next 11 days on vacation and she wants to ensure the issue is resolved.  Review of Systems  Constitutional: Negative for chills, fever and malaise/fatigue.  HENT: Negative for congestion, ear discharge, ear pain, sinus pain and sore throat.   Eyes: Negative.   Respiratory: Negative for cough, sputum production and shortness of breath.   Cardiovascular: Negative.  Negative for chest pain.  Gastrointestinal: Negative for abdominal pain, diarrhea, nausea and vomiting.  Genitourinary: Positive for frequency. Negative for dysuria, hematuria and urgency.       Urinary pressure.  Musculoskeletal: Negative for myalgias.  Skin: Negative.   Neurological: Negative for headaches.  Endo/Heme/Allergies: Negative.   Psychiatric/Behavioral: Negative.     O: Vitals:   09/17/18 1617  BP: 100/70  Pulse: 79  Temp: (!) 97.4 F (36.3 C)  SpO2: 98%     Physical Exam  Constitutional: She is oriented to person, place, and time. Vital signs are normal. She appears well-developed and well-nourished. She is active.  Non-toxic appearance. She does not have a sickly appearance.  HENT:  Head: Normocephalic.  Right Ear: Hearing, tympanic membrane, external ear and ear canal normal.  Left Ear: Hearing, tympanic membrane, external ear and ear canal normal.  Nose: Nose normal.  Mouth/Throat: Uvula is midline and oropharynx is clear and moist.  Cardiovascular: Normal rate, regular rhythm, normal heart sounds and normal pulses.  Pulmonary/Chest: Effort normal and breath  sounds normal.  Abdominal: Soft. Bowel sounds are normal. There is no tenderness. There is no rigidity, no rebound, no guarding and no CVA tenderness.  No suprapubic pain or CVA tenderness  Neurological: She is alert and oriented to person, place, and time.  Psychiatric: She has a normal mood and affect.  Vitals reviewed.  A: 1. Urinary urgency   2. Urinary tract infection without hematuria, site unspecified   3. Antibiotic-induced yeast infection    P: Discussed exam findings, diagnosis etiology and medication use and indications reviewed with patient. Follow- Up and discharge instructions provided. No emergent/urgent issues found on exam.  Patient verbalized understanding of information provided and agrees with plan of care (POC), all questions answered.  1. Urinary urgency - POCT Urinalysis Dipstick (Automated) Results for orders placed or performed in visit on 09/17/18 (from the past 24 hour(s))  POCT Urinalysis Dipstick (Automated)     Status: None   Collection Time: 09/17/18  4:20 PM  Result Value Ref Range   Color, UA yellow    Clarity, UA clear    Glucose, UA Negative Negative   Bilirubin, UA nef    Ketones, UA neg    Spec Grav, UA 1.020 1.010 - 1.025   Blood, UA neg    pH, UA 5.0 5.0 - 8.0   Protein, UA Negative Negative   Urobilinogen, UA 0.2 0.2 or 1.0 E.U./dL   Nitrite, UA neg    Leukocytes, UA Negative Negative   UA WNL today- patient is otherwise healthy and no history of chronic UTI but still symptomatic despite treatment- concern that course may have been to  short and potential for yeast infection. Discussed with patient who is anxious about trip to take medication with her and use if she needs to. Medication use and indications reviewed patient has previously tolerated well.  2. Urinary tract infection without hematuria, site unspecified - nitrofurantoin, macrocrystal-monohydrate, (MACROBID) 100 MG capsule; Take 1 capsule (100 mg total) by mouth 2 (two) times  daily for 5 days.  3. Antibiotic-induced yeast infection - fluconazole (DIFLUCAN) 150 MG tablet; Take 1 tablet (150 mg total) by mouth once for 1 dose.

## 2018-09-29 MED FILL — CITALOPRAM HBR 20 MG TABLET: 20 | 90 days supply | Qty: 90 | Fill #0

## 2019-01-21 MED FILL — CITALOPRAM HBR 20 MG TABLET: 20 | 90 days supply | Qty: 90 | Fill #0 | Status: TO

## 2019-04-03 MED FILL — CITALOPRAM HBR 20 MG TABLET: 20 | 90 days supply | Qty: 90 | Fill #0

## 2019-06-03 MED FILL — CITALOPRAM HBR 20 MG TABLET: 20 | 90 days supply | Qty: 90 | Fill #0

## 2019-06-19 MED FILL — OSCIMIN SL 0.125 MG TABLET: 0.125 | 8 days supply | Qty: 90 | Fill #0

## 2019-08-28 DIAGNOSIS — Z1231 Encounter for screening mammogram for malignant neoplasm of breast: Secondary | ICD-10-CM | POA: Diagnosis not present

## 2019-08-28 DIAGNOSIS — Z6823 Body mass index (BMI) 23.0-23.9, adult: Secondary | ICD-10-CM | POA: Diagnosis not present

## 2019-08-28 DIAGNOSIS — Z01419 Encounter for gynecological examination (general) (routine) without abnormal findings: Secondary | ICD-10-CM | POA: Diagnosis not present

## 2019-09-10 MED FILL — CITALOPRAM HBR 20 MG TABLET: 20 | 90 days supply | Qty: 90 | Fill #0

## 2019-09-28 ENCOUNTER — Other Ambulatory Visit: Payer: Self-pay

## 2019-09-28 ENCOUNTER — Encounter: Payer: Self-pay | Admitting: Plastic Surgery

## 2019-09-28 ENCOUNTER — Ambulatory Visit (INDEPENDENT_AMBULATORY_CARE_PROVIDER_SITE_OTHER): Payer: Self-pay | Admitting: Plastic Surgery

## 2019-09-28 DIAGNOSIS — Z719 Counseling, unspecified: Secondary | ICD-10-CM | POA: Insufficient documentation

## 2019-09-28 NOTE — Progress Notes (Signed)
Botulinum Toxin and Filler Injection Procedure Note  Procedure: Cosmetic botulinum toxin and Filler administration  Pre-operative Diagnosis: Dynamic rhytides and midface volume loss  Post-operative Diagnosis: Same  Complications:  None  Brief history: The patient desires botulinum toxin injection of her forehead. I discussed with the patient this proposed procedure of botulinum toxin injections, which is customized depending on the particular needs of the patient. It is performed on facial rhytids as a temporary correction. The alternatives were discussed with the patient. The risks were addressed including bleeding, scarring, infection, damage to deeper structures, asymmetry, and chronic pain, which may occur infrequently after a procedure. The individual's choice to undergo a surgical procedure is based on the comparison of risks to potential benefits. Other risks include unsatisfactory results, brow ptosis, eyelid ptosis, allergic reaction, temporary paralysis, which should go away with time, bruising, blurring disturbances and delayed healing. Botulinum toxin injections do not arrest the aging process or produce permanent tightening of the eyelid.  Operative intervention maybe necessary to maintain the results of a blepharoplasty or botulinum toxin. The patient understands and wishes to proceed. An informed consent was signed and informational brochures given to her prior to the procedure.  Procedure: The area was prepped with alcohol and dried with a clean gauze. Using a clean technique, the botulinum toxin was diluted with 1.25 cc of preservative-free normal saline which was slowly injected with an 18 gauge needle in a tuberculin syringes.  A 32 gauge needles were then used to inject the botulinum toxin. This mixture allow for an aliquot of 5 units per 0.1 cc in each injection site.    Subsequently the mixture was injected in the glabellar and forehead area with preservation of the temporal  branch to the lateral eyebrow as well as into each lateral canthal area beginning from the lateral orbital rim medial to the zygomaticus major in 3 separate areas. A total of 20 Units of botulinum toxin was used. The forehead and glabellar area was injected with care to inject intramuscular only while holding pressure on the supratrochlear vessels in each area during each injection on either side of the medial corrugators. The injection proceeded vertically superiorly to the medial 2/3 of the frontalis muscle and superior 2/3 of the lateral frontalis, again with preservation of the frontal branch.  The midface area was injected at the 3 sub-regions of the mid-face: zygomaticomalar region, anteromedial cheek region, and submalar region for a total of one syringe on each side of the face. The technique used was serial puncture with equal injections in the 3 sub-regions: the zygomaticomalar region, the anteromedial cheek, and the submalar region.  The Restylane Kysse was used at the upper lip and cupids bow.  No complications were noted. Light pressure was held for 5 minutes. She was instructed explicitly in post-operative care.  Botox LOT:  [id number] C2 EXP:  8/23  Retylane Kysse LOT: [id number] EXP: 2020-12-05  Juvederm Ultra Plus XC LOT: [id number]   The 21st Century Cures Act was signed into law in 2016 which includes the topic of electronic health records.  This provides immediate access to information in MyChart.  This includes consultation notes, operative notes, office notes, lab results and pathology reports.  If you have any questions about what you read please let us know at your next visit or call us at the office.  We are right here with you.

## 2019-11-16 DIAGNOSIS — K581 Irritable bowel syndrome with constipation: Secondary | ICD-10-CM | POA: Diagnosis not present

## 2019-11-16 DIAGNOSIS — G44209 Tension-type headache, unspecified, not intractable: Secondary | ICD-10-CM | POA: Diagnosis not present

## 2019-11-16 DIAGNOSIS — Z13220 Encounter for screening for lipoid disorders: Secondary | ICD-10-CM | POA: Diagnosis not present

## 2019-11-16 DIAGNOSIS — F329 Major depressive disorder, single episode, unspecified: Secondary | ICD-10-CM | POA: Diagnosis not present

## 2019-11-16 DIAGNOSIS — Z1159 Encounter for screening for other viral diseases: Secondary | ICD-10-CM | POA: Diagnosis not present

## 2019-11-16 DIAGNOSIS — Z0000 Encounter for general adult medical examination without abnormal findings: Secondary | ICD-10-CM | POA: Diagnosis not present

## 2019-11-16 DIAGNOSIS — K219 Gastro-esophageal reflux disease without esophagitis: Secondary | ICD-10-CM | POA: Diagnosis not present

## 2019-12-31 MED FILL — CITALOPRAM HBR 20 MG TABLET: 20 | 90 days supply | Qty: 90 | Fill #0

## 2020-02-19 ENCOUNTER — Encounter: Payer: 59 | Admitting: Plastic Surgery

## 2020-02-22 ENCOUNTER — Telehealth: Payer: Self-pay | Admitting: Plastic Surgery

## 2020-02-22 MED ORDER — LIDOCAINE-PRILOCAINE 2.5-2.5 % EX CREA: 1.0000 "application " | TOPICAL_CREAM | CUTANEOUS | 0 refills | Status: DC | PRN

## 2020-02-22 MED FILL — LIDOCAINE-PRILOCAINE CREAM: 2.5-2.5 | 30 days supply | Qty: 30 | Fill #0

## 2020-02-22 NOTE — Telephone Encounter (Signed)
EMLA sent to pharmacy for procedure.

## 2020-03-03 ENCOUNTER — Telehealth: Payer: Self-pay | Admitting: Plastic Surgery

## 2020-03-03 MED ORDER — LIDOCAINE-PRILOCAINE 2.5-2.5 % EX CREA: 1.0000 "application " | TOPICAL_CREAM | CUTANEOUS | 0 refills | Status: DC | PRN

## 2020-03-03 MED FILL — LIDOCAINE-PRILOCAINE CREAM: 2.5-2.5 | 20 days supply | Qty: 30 | Fill #0

## 2020-03-03 NOTE — Telephone Encounter (Signed)
-----   Message from Eugenia Mcalpine sent at 03/03/2020  2:59 PM EDT ----- Mrs. Florer called in about having EMLA cream called in so she'll have it before June 7th's appt. She uses the [facility]

## 2020-03-14 ENCOUNTER — Ambulatory Visit (INDEPENDENT_AMBULATORY_CARE_PROVIDER_SITE_OTHER): Payer: Self-pay | Admitting: Plastic Surgery

## 2020-03-14 ENCOUNTER — Encounter: Payer: Self-pay | Admitting: Plastic Surgery

## 2020-03-14 ENCOUNTER — Other Ambulatory Visit: Payer: Self-pay

## 2020-03-14 VITALS — BP 118/82 | HR 66 | Temp 98.7°F | Ht 64.0 in | Wt 127.0 lb

## 2020-03-14 DIAGNOSIS — Z719 Counseling, unspecified: Secondary | ICD-10-CM

## 2020-03-14 NOTE — Progress Notes (Signed)
Procedure Note  Preoperative Dx: Cellulitis of posterior thigh  Postoperative Dx: Same  Procedure: RF microneedling  Anesthesia: Lidocaine 1% with 1:100,000 epinepherine    Description of Procedure: Risks and complications were explained to the patient.   Consent was confirmed and the patient understands the risks and benefits.  The potential complications and alternatives were explained and the patient consents.  The patient expressed understanding the option of not having the procedure and the risks of a scar.  Time out was called and all information was confirmed to be correct.    The posterior left thigh was examined for cellulite in the standing position.  She was then marked with a marking pen and allowed to lay prone on the procedure table.  The leg was prepped with alcohol.  Using the multipronged needle cartridge 1% lidocaine with epinephrine was injected in the left posterior thigh.  It was injected with a 50 to 50% mixture of the lidocaine with epinephrine 1% and injectable normal saline.  A total of 20 cc was utilized of the lidocaine.  The subcutaneous area where the local was placed started to show signs of blanching.  The area was massaged until now.  The RF microneedling device was selected.  The temperature was set at 69 C and speed of 4.  Using the crosshatched technique the area of the cellulitis was microneedle and the RF utilized.  The patient tolerated the procedure very well.  There were no complications.   The patient was given instructions on how to care for the area and a follow up appointment.  Ernie tolerated the procedure well and there were no complications.

## 2020-04-04 DIAGNOSIS — Z8371 Family history of colonic polyps: Secondary | ICD-10-CM | POA: Diagnosis not present

## 2020-04-04 DIAGNOSIS — Z1211 Encounter for screening for malignant neoplasm of colon: Secondary | ICD-10-CM | POA: Diagnosis not present

## 2020-04-04 DIAGNOSIS — R1084 Generalized abdominal pain: Secondary | ICD-10-CM | POA: Diagnosis not present

## 2020-04-04 DIAGNOSIS — K582 Mixed irritable bowel syndrome: Secondary | ICD-10-CM | POA: Diagnosis not present

## 2020-04-04 MED FILL — DICYCLOMINE 20 MG TABLET: 20 | 90 days supply | Qty: 270 | Fill #0

## 2020-04-08 ENCOUNTER — Ambulatory Visit: Payer: 59 | Admitting: Plastic Surgery

## 2020-04-08 ENCOUNTER — Other Ambulatory Visit: Payer: Self-pay

## 2020-04-08 ENCOUNTER — Encounter: Payer: Self-pay | Admitting: Plastic Surgery

## 2020-04-08 ENCOUNTER — Ambulatory Visit (INDEPENDENT_AMBULATORY_CARE_PROVIDER_SITE_OTHER): Payer: Self-pay | Admitting: Plastic Surgery

## 2020-04-08 VITALS — BP 144/77 | HR 69 | Temp 98.7°F

## 2020-04-08 DIAGNOSIS — Z719 Counseling, unspecified: Secondary | ICD-10-CM

## 2020-04-08 NOTE — Progress Notes (Signed)
The patient went underwent microneedling to the left lateral posterior thigh.  There are no signs of infection whatsoever.  Patient tolerated the pain well.  At this point in time it is difficult to see any difference.  Pictures were obtained of the patient and placed in the chart with the patient's or guardian's permission. I will see her back in the next few months.

## 2020-05-26 MED FILL — CITALOPRAM HBR 20 MG TABLET: 20 | 90 days supply | Qty: 90 | Fill #0

## 2020-06-08 ENCOUNTER — Encounter (INDEPENDENT_AMBULATORY_CARE_PROVIDER_SITE_OTHER): Payer: Self-pay | Admitting: Plastic Surgery

## 2020-06-08 ENCOUNTER — Other Ambulatory Visit: Payer: Self-pay

## 2020-06-08 DIAGNOSIS — Z719 Counseling, unspecified: Secondary | ICD-10-CM

## 2020-08-16 ENCOUNTER — Other Ambulatory Visit: Payer: Self-pay

## 2020-08-16 ENCOUNTER — Encounter: Payer: Self-pay | Admitting: Plastic Surgery

## 2020-08-16 ENCOUNTER — Ambulatory Visit (INDEPENDENT_AMBULATORY_CARE_PROVIDER_SITE_OTHER): Payer: Self-pay | Admitting: Plastic Surgery

## 2020-08-16 VITALS — BP 124/77 | HR 57

## 2020-08-16 DIAGNOSIS — Z719 Counseling, unspecified: Secondary | ICD-10-CM

## 2020-08-16 NOTE — Progress Notes (Signed)
Botulinum Toxin Procedure Note  Procedure: Cosmetic botulinum toxin   Pre-operative Diagnosis: Dynamic rhytides  Post-operative Diagnosis: Same  Complications:  None  Brief history: The patient desires botulinum toxin injection of her forehead. I discussed with the patient this proposed procedure of botulinum toxin injections, which is customized depending on the particular needs of the patient. It is performed on facial rhytids as a temporary correction. The alternatives were discussed with the patient. The risks were addressed including bleeding, scarring, infection, damage to deeper structures, asymmetry, and chronic pain, which may occur infrequently after a procedure. The individual's choice to undergo a surgical procedure is based on the comparison of risks to potential benefits. Other risks include unsatisfactory results, brow ptosis, eyelid ptosis, allergic reaction, temporary paralysis, which should go away with time, bruising, blurring disturbances and delayed healing. Botulinum toxin injections do not arrest the aging process or produce permanent tightening of the eyelid.  Operative intervention maybe necessary to maintain the results of a blepharoplasty or botulinum toxin. The patient understands and wishes to proceed.  Procedure: The area was prepped with alcohol and dried with a clean gauze. Using a clean technique, the botulinum toxin was diluted with 1.25 cc of preservative-free normal saline which was slowly injected with an 18 gauge needle in a tuberculin syringes.  A 32 gauge needles were then used to inject the botulinum toxin. This mixture allow for an aliquot of 5 units per 0.1 cc in each injection site.    Subsequently the mixture was injected in the glabellar and forehead area with preservation of the temporal branch to the lateral eyebrow as well as into each lateral canthal area beginning from the lateral orbital rim medial to the zygomaticus major in 3 separate areas. A total  of 30 Units of botulinum toxin was used. The forehead and glabellar area was injected with care to inject intramuscular only while holding pressure on the supratrochlear vessels in each area during each injection on either side of the medial corrugators. The injection proceeded vertically superiorly to the medial 2/3 of the frontalis muscle and superior 2/3 of the lateral frontalis, again with preservation of the frontal branch.  No complications were noted. Light pressure was held for 5 minutes. She was instructed explicitly in post-operative care.  Botox LOT:  [id number] EXP:  1/24 

## 2020-09-05 ENCOUNTER — Other Ambulatory Visit (HOSPITAL_COMMUNITY): Payer: Self-pay | Admitting: Obstetrics and Gynecology

## 2020-09-05 DIAGNOSIS — Z01419 Encounter for gynecological examination (general) (routine) without abnormal findings: Secondary | ICD-10-CM | POA: Diagnosis not present

## 2020-09-05 DIAGNOSIS — F419 Anxiety disorder, unspecified: Secondary | ICD-10-CM | POA: Diagnosis not present

## 2020-09-05 DIAGNOSIS — Z6823 Body mass index (BMI) 23.0-23.9, adult: Secondary | ICD-10-CM | POA: Diagnosis not present

## 2020-09-05 DIAGNOSIS — Z1231 Encounter for screening mammogram for malignant neoplasm of breast: Secondary | ICD-10-CM | POA: Diagnosis not present

## 2020-09-05 MED FILL — CITALOPRAM HBR 20 MG TABLET: 20 | 90 days supply | Qty: 90 | Fill #0

## 2020-09-16 MED FILL — CITALOPRAM HBR 20 MG TABLET: 20 | 90 days supply | Qty: 90 | Fill #0

## 2020-09-20 ENCOUNTER — Other Ambulatory Visit: Payer: Self-pay

## 2020-09-20 ENCOUNTER — Encounter: Payer: Self-pay | Admitting: Plastic Surgery

## 2020-09-20 ENCOUNTER — Ambulatory Visit (INDEPENDENT_AMBULATORY_CARE_PROVIDER_SITE_OTHER): Payer: Self-pay | Admitting: Plastic Surgery

## 2020-09-20 VITALS — BP 140/83 | HR 64 | Temp 97.8°F

## 2020-09-20 DIAGNOSIS — Z719 Counseling, unspecified: Secondary | ICD-10-CM

## 2020-09-20 NOTE — Progress Notes (Signed)
Botulinum Toxin Procedure Note  Procedure: Cosmetic botulinum toxin  Pre-operative Diagnosis: Dynamic rhytides   Post-operative Diagnosis: Same  Complications:  None  Brief history: The patient desires botulinum toxin injection of her forehead. I discussed with the patient this proposed procedure of botulinum toxin injections, which is customized depending on the particular needs of the patient. It is performed on facial rhytids as a temporary correction. The alternatives were discussed with the patient. The risks were addressed including bleeding, scarring, infection, damage to deeper structures, asymmetry, and chronic pain, which may occur infrequently after a procedure. The individual's choice to undergo a surgical procedure is based on the comparison of risks to potential benefits. Other risks include unsatisfactory results, brow ptosis, eyelid ptosis, allergic reaction, temporary paralysis, which should go away with time, bruising, blurring disturbances and delayed healing. Botulinum toxin injections do not arrest the aging process or produce permanent tightening of the eyelid.  Operative intervention maybe necessary to maintain the results of a blepharoplasty or botulinum toxin. The patient understands and wishes to proceed.  Procedure: The area was prepped with alcohol and dried with a clean gauze. Using a clean technique, the botulinum toxin was diluted with 1.25 cc of preservative-free normal saline which was slowly injected with an 18 gauge needle in a tuberculin syringes.  A 32 gauge needles were then used to inject the botulinum toxin. This mixture allow for an aliquot of 5 units per 0.1 cc in each injection site.    Subsequently the mixture was injected in the glabellar area and then the lateral forehead area. A total of 16 Units of botulinum toxin was used.  No complications were noted. Light pressure was held for 5 minutes. She was instructed explicitly in post-operative  care.  Botox LOT:  [id number] C4 EXP:  3/24

## 2020-12-26 MED FILL — CITALOPRAM HBR 20 MG TABLET: 20 | 90 days supply | Qty: 90 | Fill #1

## 2021-01-24 ENCOUNTER — Other Ambulatory Visit (HOSPITAL_COMMUNITY): Payer: Self-pay

## 2021-01-31 ENCOUNTER — Encounter: Payer: Self-pay | Admitting: Plastic Surgery

## 2021-01-31 ENCOUNTER — Other Ambulatory Visit: Payer: Self-pay

## 2021-01-31 ENCOUNTER — Ambulatory Visit (INDEPENDENT_AMBULATORY_CARE_PROVIDER_SITE_OTHER): Payer: Self-pay | Admitting: Plastic Surgery

## 2021-01-31 VITALS — BP 132/78 | HR 65

## 2021-01-31 DIAGNOSIS — Z719 Counseling, unspecified: Secondary | ICD-10-CM

## 2021-01-31 NOTE — Progress Notes (Signed)
Preoperative Dx: Hyperpigmentation of face  Postoperative Dx:  same  Procedure: laser to face  Anesthesia: none  Description of Procedure:  Risks and complications were explained to the patient. Consent was confirmed and signed. Time out was called and all information was confirmed to be correct. The area  area was prepped with alcohol and wiped dry. The IPL laser was set at the standard for Fitzpatrick 1 with the Sciton laser.  The face was lasered. The patient tolerated the procedure well and there were no complications. The patient is to follow up in 4 weeks.

## 2021-04-19 DIAGNOSIS — M810 Age-related osteoporosis without current pathological fracture: Secondary | ICD-10-CM | POA: Diagnosis not present

## 2021-04-24 ENCOUNTER — Other Ambulatory Visit: Payer: Self-pay

## 2021-04-24 ENCOUNTER — Ambulatory Visit (INDEPENDENT_AMBULATORY_CARE_PROVIDER_SITE_OTHER): Payer: Self-pay | Admitting: Plastic Surgery

## 2021-04-24 DIAGNOSIS — Z719 Counseling, unspecified: Secondary | ICD-10-CM

## 2021-04-26 ENCOUNTER — Encounter: Payer: Self-pay | Admitting: Plastic Surgery

## 2021-04-26 NOTE — Progress Notes (Signed)

## 2021-04-28 ENCOUNTER — Other Ambulatory Visit (HOSPITAL_COMMUNITY): Payer: Self-pay

## 2021-04-28 DIAGNOSIS — A5900 Urogenital trichomoniasis, unspecified: Secondary | ICD-10-CM | POA: Diagnosis not present

## 2021-04-28 DIAGNOSIS — N760 Acute vaginitis: Secondary | ICD-10-CM | POA: Diagnosis not present

## 2021-04-28 MED ORDER — METRONIDAZOLE 500 MG PO TABS
2000.0000 mg | ORAL_TABLET | Freq: Once | ORAL | 0 refills | Status: AC
  Filled 2021-04-28: qty 4, 1d supply, fill #0

## 2021-04-28 MED ORDER — NYSTATIN-TRIAMCINOLONE 100000-0.1 UNIT/GM-% EX CREA
TOPICAL_CREAM | CUTANEOUS | 1 refills | Status: DC
  Filled 2021-04-28: qty 30, 20d supply, fill #0

## 2021-04-28 MED ORDER — METRONIDAZOLE 0.75 % VA GEL
1.0000 | Freq: Every day | VAGINAL | 0 refills | Status: DC
  Filled 2021-04-28: qty 70, 5d supply, fill #0

## 2021-05-01 ENCOUNTER — Other Ambulatory Visit (HOSPITAL_COMMUNITY): Payer: Self-pay

## 2021-05-01 MED FILL — Citalopram Hydrobromide Tab 20 MG (Base Equiv): ORAL | 90 days supply | Qty: 90 | Fill #0 | Status: AC

## 2021-07-21 ENCOUNTER — Ambulatory Visit (INDEPENDENT_AMBULATORY_CARE_PROVIDER_SITE_OTHER): Payer: Self-pay | Admitting: Plastic Surgery

## 2021-07-21 ENCOUNTER — Encounter: Payer: Self-pay | Admitting: Plastic Surgery

## 2021-07-21 ENCOUNTER — Other Ambulatory Visit: Payer: Self-pay

## 2021-07-21 DIAGNOSIS — Z719 Counseling, unspecified: Secondary | ICD-10-CM

## 2021-07-21 NOTE — Progress Notes (Signed)
Botulinum Toxin and Filler Injection Procedure Note  Procedure: Cosmetic botulinum toxin and Filler administration  Pre-operative Diagnosis: Dynamic rhytides  Post-operative Diagnosis: Same  Complications:  None  Brief history: The patient desires botulinum toxin injection of her forehead. I discussed with the patient this proposed procedure of botulinum toxin injections, which is customized depending on the particular needs of the patient. It is performed on facial rhytids as a temporary correction. The alternatives were discussed with the patient. The risks were addressed including bleeding, scarring, infection, damage to deeper structures, asymmetry, and chronic pain, which may occur infrequently after a procedure. The individual's choice to undergo a surgical procedure is based on the comparison of risks to potential benefits. Other risks include unsatisfactory results, brow ptosis, eyelid ptosis, allergic reaction, temporary paralysis, which should go away with time, bruising, blurring disturbances and delayed healing. Botulinum toxin injections do not arrest the aging process or produce permanent tightening of the eyelid.  Operative intervention maybe necessary to maintain the results of a blepharoplasty or botulinum toxin. The patient understands and wishes to proceed.  Procedure: The area was prepped with alcohol and dried with a clean gauze. Using a clean technique, the botulinum toxin was diluted with 1.25 cc of preservative-free normal saline which was slowly injected with an 18 gauge needle in a tuberculin syringes.  A 32 gauge needles were then used to inject the botulinum toxin. This mixture allow for an aliquot of 4 units per 0.1 cc in each injection site.    Subsequently the mixture was injected in the glabellar and forehead area with preservation of the temporal branch to the lateral eyebrow as well as into each lateral canthal area beginning from the lateral orbital rim medial to the  zygomaticus major in 3 separate areas. A total of 28 Units of botulinum toxin was used. The forehead and glabellar area was injected with care to inject intramuscular only while holding pressure on the supratrochlear vessels in each area during each injection on either side of the medial corrugators. The injection proceeded vertically superiorly to the medial 2/3 of the frontalis muscle and superior 2/3 of the lateral frontalis, again with preservation of the frontal branch.  The nasal labial folds were injected with a canula with more than half placed on the left side..  No complications were noted. Light pressure was held for 5 minutes. She was instructed explicitly in post-operative care.  Botox LOT:  [id number] EXP:  7/24  J. Ultra Plus XC 0.55 ml LOT: [id number] EXP: 2022-01-17

## 2021-07-28 DIAGNOSIS — N760 Acute vaginitis: Secondary | ICD-10-CM | POA: Diagnosis not present

## 2021-09-13 ENCOUNTER — Other Ambulatory Visit (HOSPITAL_COMMUNITY): Payer: Self-pay

## 2021-09-13 DIAGNOSIS — F418 Other specified anxiety disorders: Secondary | ICD-10-CM | POA: Diagnosis not present

## 2021-09-13 DIAGNOSIS — Z6822 Body mass index (BMI) 22.0-22.9, adult: Secondary | ICD-10-CM | POA: Diagnosis not present

## 2021-09-13 DIAGNOSIS — Z1231 Encounter for screening mammogram for malignant neoplasm of breast: Secondary | ICD-10-CM | POA: Diagnosis not present

## 2021-09-13 DIAGNOSIS — Z01419 Encounter for gynecological examination (general) (routine) without abnormal findings: Secondary | ICD-10-CM | POA: Diagnosis not present

## 2021-09-13 DIAGNOSIS — F419 Anxiety disorder, unspecified: Secondary | ICD-10-CM | POA: Diagnosis not present

## 2021-09-13 MED ORDER — CITALOPRAM HYDROBROMIDE 20 MG PO TABS
20.0000 mg | ORAL_TABLET | Freq: Every day | ORAL | 4 refills | Status: DC
  Filled 2021-09-13: qty 90, 90d supply, fill #0
  Filled 2022-01-17: qty 90, 90d supply, fill #1

## 2021-10-30 ENCOUNTER — Encounter: Payer: Self-pay | Admitting: Plastic Surgery

## 2021-10-30 ENCOUNTER — Ambulatory Visit (INDEPENDENT_AMBULATORY_CARE_PROVIDER_SITE_OTHER): Payer: Self-pay | Admitting: Plastic Surgery

## 2021-10-30 ENCOUNTER — Other Ambulatory Visit: Payer: Self-pay

## 2021-10-30 DIAGNOSIS — Z719 Counseling, unspecified: Secondary | ICD-10-CM

## 2021-10-31 ENCOUNTER — Other Ambulatory Visit: Payer: 59 | Admitting: Plastic Surgery

## 2021-10-31 ENCOUNTER — Encounter: Payer: Self-pay | Admitting: Plastic Surgery

## 2021-10-31 NOTE — Progress Notes (Signed)
HALO Treatment       Topical and/or Block: Lidocaine 23% and Tetracaine 7%  Post Care: Instructions printed for Patient  Notes: The machine did a reset so the settings were not captured.  The face, neck, perioral and hands were done on moderate settings.

## 2021-12-08 DIAGNOSIS — Z719 Counseling, unspecified: Secondary | ICD-10-CM

## 2021-12-21 DIAGNOSIS — G5761 Lesion of plantar nerve, right lower limb: Secondary | ICD-10-CM | POA: Diagnosis not present

## 2022-01-17 ENCOUNTER — Other Ambulatory Visit (HOSPITAL_COMMUNITY): Payer: Self-pay

## 2022-03-13 ENCOUNTER — Ambulatory Visit (INDEPENDENT_AMBULATORY_CARE_PROVIDER_SITE_OTHER): Payer: 59

## 2022-03-13 ENCOUNTER — Encounter: Payer: Self-pay | Admitting: Internal Medicine

## 2022-03-13 ENCOUNTER — Other Ambulatory Visit (HOSPITAL_COMMUNITY): Payer: Self-pay

## 2022-03-13 ENCOUNTER — Ambulatory Visit: Payer: 59 | Admitting: Internal Medicine

## 2022-03-13 DIAGNOSIS — R059 Cough, unspecified: Secondary | ICD-10-CM | POA: Diagnosis not present

## 2022-03-13 DIAGNOSIS — R058 Other specified cough: Secondary | ICD-10-CM

## 2022-03-13 LAB — CBC WITH DIFFERENTIAL/PLATELET
Basophils Absolute: 0 10*3/uL (ref 0.0–0.1)
Basophils Relative: 0.6 % (ref 0.0–3.0)
Eosinophils Absolute: 0.1 10*3/uL (ref 0.0–0.7)
Eosinophils Relative: 1.8 % (ref 0.0–5.0)
HCT: 39.5 % (ref 36.0–46.0)
Hemoglobin: 12.8 g/dL (ref 12.0–15.0)
Lymphocytes Relative: 34.3 % (ref 12.0–46.0)
Lymphs Abs: 2.1 10*3/uL (ref 0.7–4.0)
MCHC: 32.4 g/dL (ref 30.0–36.0)
MCV: 85.6 fl (ref 78.0–100.0)
Monocytes Absolute: 0.4 10*3/uL (ref 0.1–1.0)
Monocytes Relative: 7 % (ref 3.0–12.0)
Neutro Abs: 3.4 10*3/uL (ref 1.4–7.7)
Neutrophils Relative %: 56.3 % (ref 43.0–77.0)
Platelets: 227 10*3/uL (ref 150.0–400.0)
RBC: 4.61 Mil/uL (ref 3.87–5.11)
RDW: 13.5 % (ref 11.5–15.5)
WBC: 6 10*3/uL (ref 4.0–10.5)

## 2022-03-13 LAB — IGE: IgE (Immunoglobulin E), Serum: 4 kU/L (ref <=114)

## 2022-03-13 MED ORDER — PANTOPRAZOLE SODIUM 40 MG PO TBEC
40.0000 mg | DELAYED_RELEASE_TABLET | Freq: Every day | ORAL | 2 refills | Status: DC
  Filled 2022-03-13: qty 90, 90d supply, fill #0

## 2022-03-13 MED ORDER — FAMOTIDINE 20 MG PO TABS
20.0000 mg | ORAL_TABLET | Freq: Every day | ORAL | 11 refills | Status: AC
  Filled 2022-03-13: qty 90, 90d supply, fill #0
  Filled 2022-07-09: qty 90, 90d supply, fill #1

## 2022-03-13 MED ORDER — BENZONATATE 200 MG PO CAPS
200.0000 mg | ORAL_CAPSULE | Freq: Three times a day (TID) | ORAL | 5 refills | Status: DC | PRN
  Filled 2022-03-13: qty 45, 15d supply, fill #0

## 2022-03-13 NOTE — Patient Instructions (Signed)
Pantoprazole (protonix) 40 mg   Take  30-60 min before first meal of the day and Pepcid (famotidine)  20 mg after supper until return to office - this is the best way to tell whether stomach acid is contributing to your problem.    For drainage / throat tickle try take CHLORPHENIRAMINE  4 mg  ("Allergy Relief" 4mg   at [facility] should be easiest to find in the blue box usually on bottom shelf)  take one every 4 hours as needed - extremely effective and inexpensive over the counter- may cause drowsiness so start with just a dose or two an hour before bedtime and see how you tolerate it before trying in daytime.   GERD (REFLUX)  is an extremely common cause of respiratory symptoms just like yours , many times with no obvious heartburn at all.    It can be treated with medication, but also with lifestyle changes including elevation of the head of your bed (ideally with 6-8inch blocks/risers  under the headboard of your bed),  Smoking cessation, avoidance of late meals, excessive alcohol, and avoid fatty foods, chocolate, peppermint, colas, red wine, and acidic juices such as orange juice.  NO MINT OR MENTHOL PRODUCTS SO NO COUGH DROPS  USE SUGARLESS CANDY INSTEAD (Jolley ranchers or Stover's or Life Savers) or even ice chips will also do - the key is to swallow to prevent all throat clearing. NO OIL BASED VITAMINS - use powdered substitutes.  Avoid fish oil when coughing.   Please remember to go to the lab and x-ray department  for your tests - we will call you with the results when they are available.       For cough >  tessilon 200 mg up to 3 times a day.    Call if not better in 6 weeks for step @2 

## 2022-03-13 NOTE — Progress Notes (Signed)
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 If in present house at that time  worse since Covid April 2023     Kouffman Reflux v Neurogenic Cough Differentiator Reflux Comments  Do you awaken from a sound sleep coughing violently?                            With trouble breathing? Once or twice cough wakes her    Do you have choking episodes when you cannot  Get enough air, gasping for air ?              Yes   Do you usually cough when you lie down into  The bed, or when you just lie down to rest ?                          No    Do you usually cough after meals or eating?         Chokes with meals regardless of texure   Do you cough when (or after) you bend over?    no   GERD SCORE     Kouffman Reflux v Neurogenic Cough Differentiator Neurogenic   Do you more-or-less cough all day long? Sporadic    Does change of temperature make you cough? Really cold or walking    Does laughing or chuckling cause you to cough? None    Do fumes (perfume, automobile fumes, burned  Toast, etc.,) cause you to cough ?      Bovie    Does speaking, singing, or talking on the phone cause you to cough   ?               Singing    Neurogenic/Airway score            History of Present Illness  03/13/2022  Pulmonary/ 1st office eval/Alessandro Griep  Chief Complaint  Patient presents with   Pulmonary Consult    Self referral. Pt c/o cough for the past several years- non prod with no specific trigger.   Dyspnea:  none walking hills  Cough: dry hack with choking / during  meal once a week feels like choking /asp never needed Heimlich/ former w/u by Annalee Genta remotely, neg per pt   SABA use: none  Only uses tums for overt gerd Assoc voice change  No obvious day to day or daytime pattern/variability or assoc excess/ purulent sputum or mucus plugs or hemoptysis  or cp or chest tightness, subjective wheeze or overt sinus or hb symptoms.   Sleeps  without nocturnal  or early am exacerbation  of respiratory  c/o's or need for noct saba. Also denies any obvious fluctuation of symptoms with weather or environmental changes or other aggravating or alleviating factors except as outlined above   No unusual exposure hx or h/o childhood pna/ asthma or knowledge of premature birth.  Current Allergies, Complete Past Medical History, Past Surgical History, Family History, and Social History were reviewed in [employer] record.  ROS  The following are not active complaints unless bolded Hoarseness, sore throat, dysphagia, dental problems, itching, sneezing,  nasal congestion or discharge of excess mucus or purulent secretions, ear ache,   fever,  chills, sweats, unintended wt loss or wt gain, classically pleuritic or exertional cp,  orthopnea pnd or arm/hand swelling  or leg swelling, presyncope, palpitations, abdominal pain, anorexia, nausea, vomiting, diarrhea  or change in bowel habits or change in bladder habits, change in stools or change in urine, dysuria, hematuria,  rash, arthralgias, visual complaints, headache, numbness, weakness or ataxia or problems with walking or coordination,  change in mood or  memory.             No past medical history on file.  Outpatient Medications Prior to Visit  Medication Sig Dispense Refill   citalopram (CELEXA) 20 MG tablet TAKE 1 TABLET BY MOUTH EVERY DAY 90 tablet 4   citalopram (CELEXA) 20 MG tablet Take 20 mg by mouth daily.  0   citalopram (CELEXA) 20 MG tablet Take 1 tablet (20 mg total) by mouth daily. 90 tablet 4   lidocaine-prilocaine (EMLA) cream Apply 1 application topically as needed. Apply to lower face and neck 40 min prior to procedure 30 g 0   lidocaine-prilocaine (EMLA) cream Apply 1 application topically as needed. 30 g 0   methocarbamol (ROBAXIN) 500 MG tablet Take 500 mg by mouth  as needed.  3   metroNIDAZOLE (METROGEL) 0.75 % vaginal gel Place 1 Applicatorful vaginally daily for 5 days. 70 g 0   nystatin-triamcinolone (MYCOLOG II) cream APPLY TO THE AFFECTED AREA(S) BY TOPICAL ROUTE 2 TIMES PER DAY IN THE MORNING AND EVENING 30 g 1   No facility-administered medications prior to visit.     Objective:     BP 126/76 (BP Location: Left Arm, Cuff Size: Normal)   Pulse (!) 58   Temp 98 F (36.7 C) (Oral)   Ht 5' 3.5" (1.613 m)   Wt 131 lb (59.4 kg)   SpO2 99% Comment: on RA  BMI 22.84 kg/m   SpO2: 99 % (on RA)  Amb wf nad classic voice fatigue/ freq throat clearing   HEENT : Oropharynx min erythema s excess pnd/  Nasal turbinates nl / ear  canals obst on R with wax but no cough elicited / L nl TM   NECK :  without  appent JVD/ palpable Nodes/TM    LUNGS: no acc muscle use,  Nl contour chest which is clear to A and P bilaterally without cough on insp or exp maneuvers   CV:  RRR  no s3 or murmur or increase in P2, and no edema   ABD:  soft and nontender with nl inspiratory excursion in the supine position. No bruits or organomegaly appreciated   MS:  Nl gait/ ext warm without deformities Or obvious joint restrictions  calf tenderness, cyanosis or clubbing     SKIN: warm and dry without lesions    NEURO:  alert, approp, nl sensorium with  no motor or cerebellar deficits apparent.    CXR PA and Lateral:   03/13/2022 :    I personally reviewed images and impression is as follows:     Mild hyperinflation / no acute dz     Assessment   Upper airway cough syndrome Onset ? Around 2000 eval by shoemaker neg (prior to Epic) assoc with overt GERD by hx/ classic voice fatigue on exam - Allergy screen 03/13/2022 >  Eos 0.1 /  IgE 4   Of the three most common causes of  Sub-acute / recurrent or chronic cough, only one (GERD)  can actually contribute to/ trigger  the other two (asthma and post nasal  drip syndrome)  and perpetuate the cylce of cough.  While  not intuitively obvious, many patients with chronic low grade reflux do not cough until there is a primary insult that disturbs the protective epithelial barrier and exposes sensitive nerve endings.   This is typically viral but can due to PNDS and  either may apply here.  >>>    The point is that once this occurs, it is difficult to eliminate the cycle  using anything but a maximally effective acid suppression regimen at least in the short run, accompanied by an appropriate diet to address non acid GERD and control pnds with 1st gen H1 blockers per guidelines  And  eliminate the cough itself for at least 5-7 days with prn tessalon with next option trial of gabapentin vs [facility]         Each maintenance medication was reviewed in detail including emphasizing most importantly the difference between maintenance and prns and under what circumstances the prns are to be triggered using an action plan format where appropriate.  Total time for H and P, chart review, counseling,  and generating customized AVS unique to this new pt office visit / same day charting = >45 min for  refractory respiratory  symptoms of uncertain etiology            Sandrea Hughs, MD 03/13/2022

## 2022-03-14 ENCOUNTER — Encounter: Payer: Self-pay | Admitting: Internal Medicine

## 2022-03-14 NOTE — Assessment & Plan Note (Addendum)
Onset ? Around 2000 eval by shoemaker neg (prior to Epic) assoc with overt GERD by hx/ classic voice fatigue on exam - Allergy screen 03/13/2022 >  Eos 0.1 /  IgE 4   Of the three most common causes of  Sub-acute / recurrent or chronic cough, only one (GERD)  can actually contribute to/ trigger  the other two (asthma and post nasal drip syndrome)  and perpetuate the cylce of cough.  While not intuitively obvious, many patients with chronic low grade reflux do not cough until there is a primary insult that disturbs the protective epithelial barrier and exposes sensitive nerve endings.   This is typically viral but can due to PNDS and  either may apply here.  >>>    The point is that once this occurs, it is difficult to eliminate the cycle  using anything but a maximally effective acid suppression regimen at least in the short run, accompanied by an appropriate diet to address non acid GERD and control pnds with 1st gen H1 blockers per guidelines  And  eliminate the cough itself for at least 5-7 days with prn tessalon with next option trial of gabapentin vs [facility]         Each maintenance medication was reviewed in detail including emphasizing most importantly the difference between maintenance and prns and under what circumstances the prns are to be triggered using an action plan format where appropriate.  Total time for H and P, chart review, counseling,  and generating customized AVS unique to this new pt office visit / same day charting = >45 min for  refractory respiratory  symptoms of uncertain etiology

## 2022-03-16 ENCOUNTER — Ambulatory Visit (INDEPENDENT_AMBULATORY_CARE_PROVIDER_SITE_OTHER): Payer: Self-pay | Admitting: Plastic Surgery

## 2022-03-16 DIAGNOSIS — Z719 Counseling, unspecified: Secondary | ICD-10-CM

## 2022-03-16 NOTE — Progress Notes (Signed)
Botulinum Toxin Procedure Note  Procedure: Cosmetic botulinum toxin   Pre-operative Diagnosis: Dynamic rhytides  Post-operative Diagnosis: Same  Complications:  None  Brief history: The patient desires botulinum toxin injection of her forehead. I discussed with the patient this proposed procedure of botulinum toxin injections, which is customized depending on the particular needs of the patient. It is performed on facial rhytids as a temporary correction. The alternatives were discussed with the patient. The risks were addressed including bleeding, scarring, infection, damage to deeper structures, asymmetry, and chronic pain, which may occur infrequently after a procedure. The individual's choice to undergo a surgical procedure is based on the comparison of risks to potential benefits. Other risks include unsatisfactory results, brow ptosis, eyelid ptosis, allergic reaction, temporary paralysis, which should go away with time, bruising, blurring disturbances and delayed healing. Botulinum toxin injections do not arrest the aging process or produce permanent tightening of the eyelid.  Operative intervention maybe necessary to maintain the results of a blepharoplasty or botulinum toxin. The patient understands and wishes to proceed. An informed consent was signed and informational brochures given to her prior to the procedure.  Procedure: The area was prepped with alcohol and dried with a clean gauze. Using a clean technique, the botulinum toxin was diluted with 3 cc of preservative-free normal saline which was slowly injected with an 18 gauge needle in a tuberculin syringes.  A 32 gauge needles were then used to inject the botulinum toxin. This mixture allow for an aliquot of 10 units per 0.1 cc in each injection site.    Subsequently the mixture was injected in the glabellar and forehead area with preservation of the temporal branch to the lateral eyebrow as well as into each lateral canthal area  beginning from the lateral orbital rim medial to the zygomaticus major in 3 separate areas. A total of 120 Units of botulinum toxin was used. The forehead and glabellar area was injected with care to inject intramuscular only while holding pressure on the supratrochlear vessels in each area during each injection on either side of the medial corrugators. The injection proceeded vertically superiorly to the medial 2/3 of the frontalis muscle and superior 2/3 of the lateral frontalis, again with preservation of the frontal branch.  No complications were noted. Light pressure was held for 5 minutes. She was instructed explicitly in post-operative care.  Dysport LOT:  [id number] EXP:  11/08/2023

## 2022-04-20 ENCOUNTER — Encounter: Payer: 59 | Admitting: Plastic Surgery

## 2022-04-20 ENCOUNTER — Ambulatory Visit (INDEPENDENT_AMBULATORY_CARE_PROVIDER_SITE_OTHER): Payer: Self-pay | Admitting: Plastic Surgery

## 2022-04-20 ENCOUNTER — Other Ambulatory Visit (HOSPITAL_COMMUNITY): Payer: Self-pay

## 2022-04-20 DIAGNOSIS — Z719 Counseling, unspecified: Secondary | ICD-10-CM

## 2022-04-20 NOTE — Progress Notes (Signed)
Dysport and Filler Injection Procedure Note  Procedure: Cosmetic botulinum toxin and Filler administration  Pre-operative Diagnosis: Dynamic rhytides and midface volume loss  Post-operative Diagnosis: Same  Complications:  None  Brief history: The patient desires botulinum toxin injection of her forehead. I discussed with the patient this proposed procedure of botulinum toxin injections, which is customized depending on the particular needs of the patient. It is performed on facial rhytids as a temporary correction. The alternatives were discussed with the patient. The risks were addressed including bleeding, scarring, infection, damage to deeper structures, asymmetry, and chronic pain, which may occur infrequently after a procedure. The individual's choice to undergo a surgical procedure is based on the comparison of risks to potential benefits. Other risks include unsatisfactory results, brow ptosis, eyelid ptosis, allergic reaction, temporary paralysis, which should go away with time, bruising, blurring disturbances and delayed healing. Botulinum toxin injections do not arrest the aging process or produce permanent tightening of the eyelid.  Operative intervention maybe necessary to maintain the results of a blepharoplasty or botulinum toxin. The patient understands and wishes to proceed. Consent was given.  Procedure: The area was prepped with alcohol and dried with a clean gauze. Using a clean technique, the botulinum toxin was diluted with 3 cc of preservative-free normal saline which was slowly injected with an 18 gauge needle in a tuberculin syringes.  A 32 gauge needles were then used to inject the botulinum toxin. This mixture allow for an aliquot of 10 units per 0.1 cc in each injection site.    The dysport was injected at the lateral brow area to improve the strong wrinkles on each side with 20 U total.  The midface area was injected at the 3 sub-regions of the mid-face: zygomaticomalar  region, anteromedial cheek region, and submalar region for a total of one syringe on each side of the face. The technique used was serial puncture with equal injections in the 3 sub-regions: the zygomaticomalar region, the anteromedial cheek, and the submalar region.  No complications were noted. Light pressure was held for 5 minutes. She was instructed explicitly in post-operative care.  Dysport LOT:  [id number] EXP:  11-08-2023  Restylane Contour x 2 LOT: [id number] EXP: 2022-12-05

## 2022-07-09 ENCOUNTER — Other Ambulatory Visit (HOSPITAL_COMMUNITY): Payer: Self-pay

## 2022-07-09 ENCOUNTER — Other Ambulatory Visit: Payer: Self-pay | Admitting: Internal Medicine

## 2022-07-09 DIAGNOSIS — R058 Other specified cough: Secondary | ICD-10-CM

## 2022-07-09 MED ORDER — PANTOPRAZOLE SODIUM 40 MG PO TBEC
40.0000 mg | DELAYED_RELEASE_TABLET | Freq: Every day | ORAL | 2 refills | Status: DC
  Filled 2022-07-09: qty 30, 30d supply, fill #0
  Filled 2022-08-27: qty 30, 30d supply, fill #1
  Filled 2022-10-02: qty 30, 30d supply, fill #2

## 2022-08-27 ENCOUNTER — Other Ambulatory Visit (HOSPITAL_COMMUNITY): Payer: Self-pay

## 2022-08-28 ENCOUNTER — Other Ambulatory Visit (HOSPITAL_COMMUNITY): Payer: Self-pay

## 2022-08-28 DIAGNOSIS — M79645 Pain in left finger(s): Secondary | ICD-10-CM | POA: Diagnosis not present

## 2022-08-28 MED ORDER — CELECOXIB 200 MG PO CAPS
200.0000 mg | ORAL_CAPSULE | Freq: Two times a day (BID) | ORAL | 0 refills | Status: DC
  Filled 2022-08-28: qty 60, 30d supply, fill #0

## 2022-10-25 ENCOUNTER — Other Ambulatory Visit (HOSPITAL_COMMUNITY): Payer: Self-pay

## 2022-10-25 DIAGNOSIS — F419 Anxiety disorder, unspecified: Secondary | ICD-10-CM | POA: Diagnosis not present

## 2022-10-25 MED ORDER — BUPROPION HCL ER (XL) 150 MG PO TB24
150.0000 mg | ORAL_TABLET | ORAL | 2 refills | Status: DC
  Filled 2022-10-25: qty 30, 30d supply, fill #0
  Filled 2022-11-28: qty 30, 30d supply, fill #1
  Filled 2022-12-31: qty 30, 30d supply, fill #2

## 2022-10-25 MED ORDER — CITALOPRAM HYDROBROMIDE 10 MG PO TABS
10.0000 mg | ORAL_TABLET | Freq: Every day | ORAL | 0 refills | Status: DC
  Filled 2022-10-25: qty 30, 30d supply, fill #0

## 2022-11-12 ENCOUNTER — Other Ambulatory Visit: Payer: Self-pay | Admitting: Internal Medicine

## 2022-11-12 ENCOUNTER — Other Ambulatory Visit (HOSPITAL_COMMUNITY): Payer: Self-pay

## 2022-11-12 DIAGNOSIS — R058 Other specified cough: Secondary | ICD-10-CM

## 2022-11-12 MED ORDER — PANTOPRAZOLE SODIUM 40 MG PO TBEC
40.0000 mg | DELAYED_RELEASE_TABLET | Freq: Every day | ORAL | 2 refills | Status: DC
  Filled 2022-11-12: qty 30, 30d supply, fill #0
  Filled 2022-12-10: qty 30, 30d supply, fill #1
  Filled 2023-01-14: qty 30, 30d supply, fill #2

## 2022-11-14 ENCOUNTER — Other Ambulatory Visit (HOSPITAL_COMMUNITY): Payer: Self-pay

## 2022-11-15 ENCOUNTER — Other Ambulatory Visit (HOSPITAL_COMMUNITY): Payer: Self-pay

## 2022-11-15 MED ORDER — CELECOXIB 200 MG PO CAPS
200.0000 mg | ORAL_CAPSULE | Freq: Two times a day (BID) | ORAL | 0 refills | Status: DC
  Filled 2022-11-15: qty 60, 30d supply, fill #0

## 2022-11-16 ENCOUNTER — Other Ambulatory Visit (HOSPITAL_COMMUNITY): Payer: Self-pay

## 2022-12-06 DIAGNOSIS — Z01419 Encounter for gynecological examination (general) (routine) without abnormal findings: Secondary | ICD-10-CM | POA: Diagnosis not present

## 2022-12-06 DIAGNOSIS — Z1231 Encounter for screening mammogram for malignant neoplasm of breast: Secondary | ICD-10-CM | POA: Diagnosis not present

## 2022-12-06 DIAGNOSIS — Z6823 Body mass index (BMI) 23.0-23.9, adult: Secondary | ICD-10-CM | POA: Diagnosis not present

## 2022-12-11 ENCOUNTER — Other Ambulatory Visit (HOSPITAL_COMMUNITY): Payer: Self-pay

## 2022-12-11 ENCOUNTER — Other Ambulatory Visit: Payer: Self-pay | Admitting: Obstetrics and Gynecology

## 2022-12-11 DIAGNOSIS — R928 Other abnormal and inconclusive findings on diagnostic imaging of breast: Secondary | ICD-10-CM

## 2022-12-11 MED ORDER — CITALOPRAM HYDROBROMIDE 10 MG PO TABS
10.0000 mg | ORAL_TABLET | Freq: Every day | ORAL | 1 refills | Status: DC
  Filled 2022-12-11: qty 90, 90d supply, fill #0
  Filled 2023-03-15: qty 90, 90d supply, fill #1

## 2022-12-31 ENCOUNTER — Other Ambulatory Visit (HOSPITAL_COMMUNITY): Payer: Self-pay

## 2023-01-03 ENCOUNTER — Other Ambulatory Visit (HOSPITAL_COMMUNITY): Payer: Self-pay

## 2023-01-03 MED ORDER — BUPROPION HCL ER (XL) 300 MG PO TB24
300.0000 mg | ORAL_TABLET | Freq: Every morning | ORAL | 1 refills | Status: DC
  Filled 2023-01-03: qty 30, 30d supply, fill #0
  Filled 2023-02-12: qty 30, 30d supply, fill #1

## 2023-01-14 ENCOUNTER — Other Ambulatory Visit (HOSPITAL_COMMUNITY): Payer: Self-pay

## 2023-01-16 ENCOUNTER — Other Ambulatory Visit (HOSPITAL_COMMUNITY): Payer: Self-pay

## 2023-01-17 ENCOUNTER — Other Ambulatory Visit: Payer: Self-pay | Admitting: Obstetrics and Gynecology

## 2023-01-17 ENCOUNTER — Ambulatory Visit: Payer: Commercial Managed Care - PPO

## 2023-01-17 ENCOUNTER — Ambulatory Visit
Admission: RE | Admit: 2023-01-17 | Discharge: 2023-01-17 | Disposition: A | Discharge status: Home / Self Care | Payer: 59 | Source: Ambulatory Visit | Attending: Obstetrics and Gynecology | Admitting: Obstetrics and Gynecology

## 2023-01-17 DIAGNOSIS — R922 Inconclusive mammogram: Secondary | ICD-10-CM | POA: Diagnosis not present

## 2023-01-17 DIAGNOSIS — R928 Other abnormal and inconclusive findings on diagnostic imaging of breast: Secondary | ICD-10-CM

## 2023-01-21 ENCOUNTER — Encounter: Payer: Self-pay | Admitting: Plastic Surgery

## 2023-01-21 ENCOUNTER — Ambulatory Visit (INDEPENDENT_AMBULATORY_CARE_PROVIDER_SITE_OTHER): Payer: Self-pay | Admitting: Plastic Surgery

## 2023-01-21 DIAGNOSIS — Z719 Counseling, unspecified: Secondary | ICD-10-CM

## 2023-01-21 NOTE — Progress Notes (Signed)
Botulinum Toxin Procedure Note  Procedure: Cosmetic botulinum toxin   Pre-operative Diagnosis: Dynamic rhytides   Post-operative Diagnosis: Same  Complications:  None  Brief history: The patient desires botulinum toxin injection of her forehead. I discussed with the patient this proposed procedure of botulinum toxin injections, which is customized depending on the particular needs of the patient. It is performed on facial rhytids as a temporary correction. The alternatives were discussed with the patient. The risks were addressed including bleeding, scarring, infection, damage to deeper structures, asymmetry, and chronic pain, which may occur infrequently after a procedure. The individual's choice to undergo a surgical procedure is based on the comparison of risks to potential benefits. Other risks include unsatisfactory results, brow ptosis, eyelid ptosis, allergic reaction, temporary paralysis, which should go away with time, bruising, blurring disturbances and delayed healing. Botulinum toxin injections do not arrest the aging process or produce permanent tightening of the eyelid.  Operative intervention maybe necessary to maintain the results of a blepharoplasty or botulinum toxin. The patient understands and wishes to proceed.  Procedure: The area was prepped with alcohol and dried with a clean gauze. Using a clean technique, the botulinum toxin was diluted with 1.25 cc of preservative-free normal saline which was slowly injected with an 18 gauge needle in a tuberculin syringes.  A 32 gauge needles were then used to inject the botulinum toxin. This mixture allow for an aliquot of 4 units per 0.1 cc in each injection site.    Subsequently the mixture was injected in the glabellar and forehead area with preservation of the temporal branch to the lateral eyebrow as well as into each lateral canthal area beginning from the lateral orbital rim medial to the zygomaticus major in 3 separate areas. A  total of 32 Units of botulinum toxin was used. The forehead and glabellar area was injected with care to inject intramuscular only while holding pressure on the supratrochlear vessels in each area during each injection on either side of the medial corrugators. The injection proceeded vertically superiorly to the medial 2/3 of the frontalis muscle and superior 2/3 of the lateral frontalis, again with preservation of the frontal branch. No complications were noted. Light pressure was held for 5 minutes. She was instructed explicitly in post-operative care.  Botox LOT:  [id number]  

## 2023-01-31 ENCOUNTER — Other Ambulatory Visit (HOSPITAL_COMMUNITY): Payer: Self-pay

## 2023-02-06 ENCOUNTER — Other Ambulatory Visit (HOSPITAL_COMMUNITY): Payer: Self-pay

## 2023-02-07 ENCOUNTER — Other Ambulatory Visit (HOSPITAL_COMMUNITY): Payer: Self-pay

## 2023-02-07 MED ORDER — CELECOXIB 200 MG PO CAPS
200.0000 mg | ORAL_CAPSULE | Freq: Two times a day (BID) | ORAL | 2 refills | Status: DC
  Filled 2023-02-07: qty 60, 30d supply, fill #0

## 2023-02-08 ENCOUNTER — Other Ambulatory Visit (HOSPITAL_COMMUNITY): Payer: Self-pay

## 2023-02-12 ENCOUNTER — Other Ambulatory Visit: Payer: Self-pay | Admitting: Internal Medicine

## 2023-02-12 ENCOUNTER — Other Ambulatory Visit (HOSPITAL_COMMUNITY): Payer: Self-pay

## 2023-02-12 DIAGNOSIS — R058 Other specified cough: Secondary | ICD-10-CM

## 2023-02-14 ENCOUNTER — Other Ambulatory Visit (HOSPITAL_COMMUNITY): Payer: Self-pay

## 2023-02-14 MED ORDER — PANTOPRAZOLE SODIUM 40 MG PO TBEC
40.0000 mg | DELAYED_RELEASE_TABLET | Freq: Every day | ORAL | 2 refills | Status: DC
Start: 2023-02-14 — End: 2024-07-07
  Filled 2023-02-14: qty 30, 30d supply, fill #0
  Filled 2023-03-15: qty 30, 30d supply, fill #1
  Filled 2023-04-17: qty 30, 30d supply, fill #2

## 2023-02-15 DIAGNOSIS — K219 Gastro-esophageal reflux disease without esophagitis: Secondary | ICD-10-CM | POA: Diagnosis not present

## 2023-02-15 DIAGNOSIS — R7989 Other specified abnormal findings of blood chemistry: Secondary | ICD-10-CM | POA: Diagnosis not present

## 2023-02-15 DIAGNOSIS — M810 Age-related osteoporosis without current pathological fracture: Secondary | ICD-10-CM | POA: Diagnosis not present

## 2023-02-15 DIAGNOSIS — F419 Anxiety disorder, unspecified: Secondary | ICD-10-CM | POA: Diagnosis not present

## 2023-02-27 ENCOUNTER — Other Ambulatory Visit (HOSPITAL_COMMUNITY): Payer: Self-pay

## 2023-02-27 MED ORDER — BUPROPION HCL ER (XL) 300 MG PO TB24
300.0000 mg | ORAL_TABLET | Freq: Every morning | ORAL | 3 refills | Status: DC
  Filled 2023-02-27 – 2023-03-15 (×2): qty 90, 90d supply, fill #0
  Filled 2023-05-16 – 2023-05-27 (×2): qty 90, 90d supply, fill #1
  Filled 2023-09-17: qty 90, 90d supply, fill #2
  Filled 2023-12-12: qty 90, 90d supply, fill #3

## 2023-03-15 ENCOUNTER — Other Ambulatory Visit (HOSPITAL_COMMUNITY): Payer: Self-pay

## 2023-03-18 ENCOUNTER — Other Ambulatory Visit (HOSPITAL_COMMUNITY): Payer: Self-pay

## 2023-04-01 IMAGING — DX DG CHEST 2V
2 series · 2 of 2 positions shown · non-contrast
Comparison: July 16, 2012

CLINICAL DATA: Cough

EXAM:
CHEST - 2 VIEW

[chest pa]
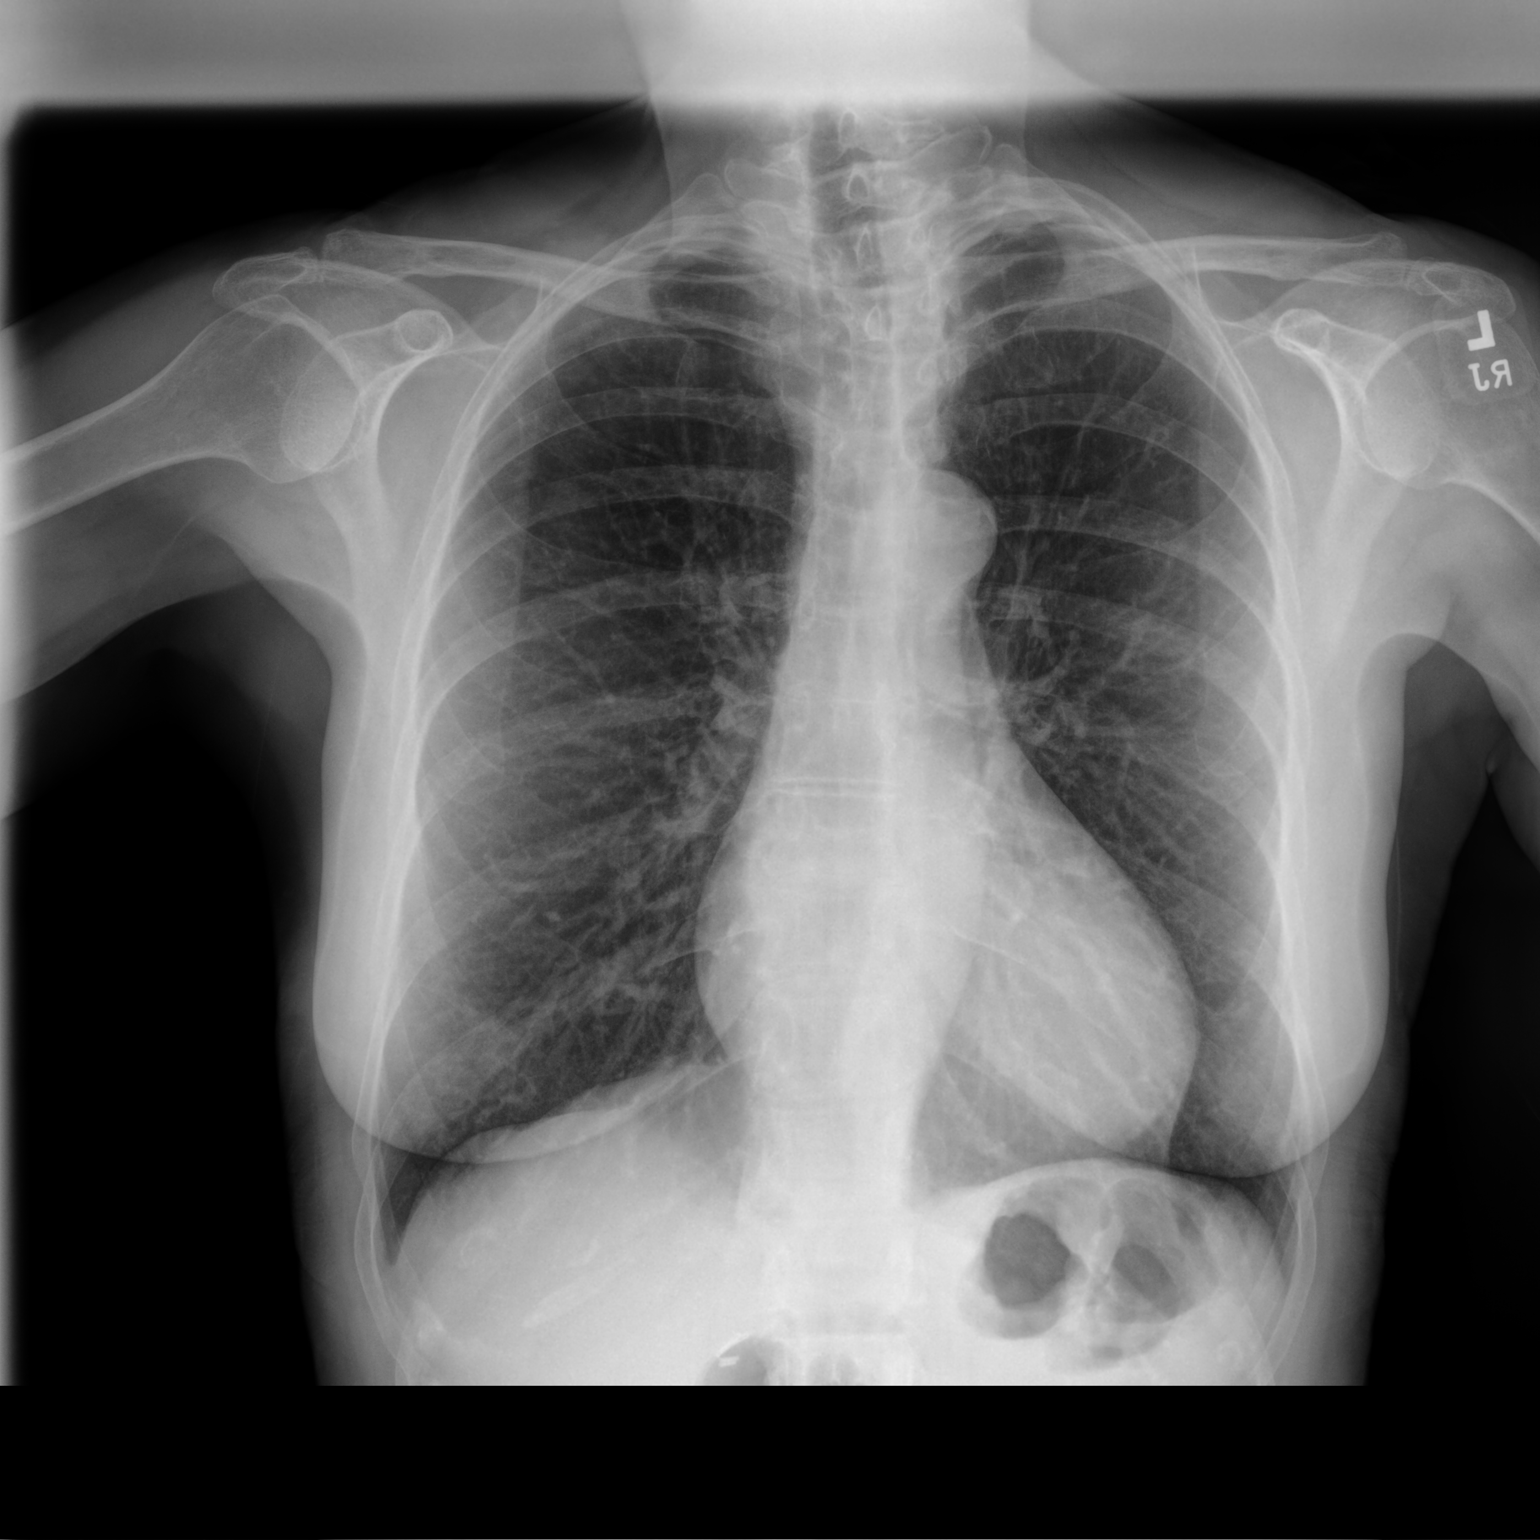

[chest lat]
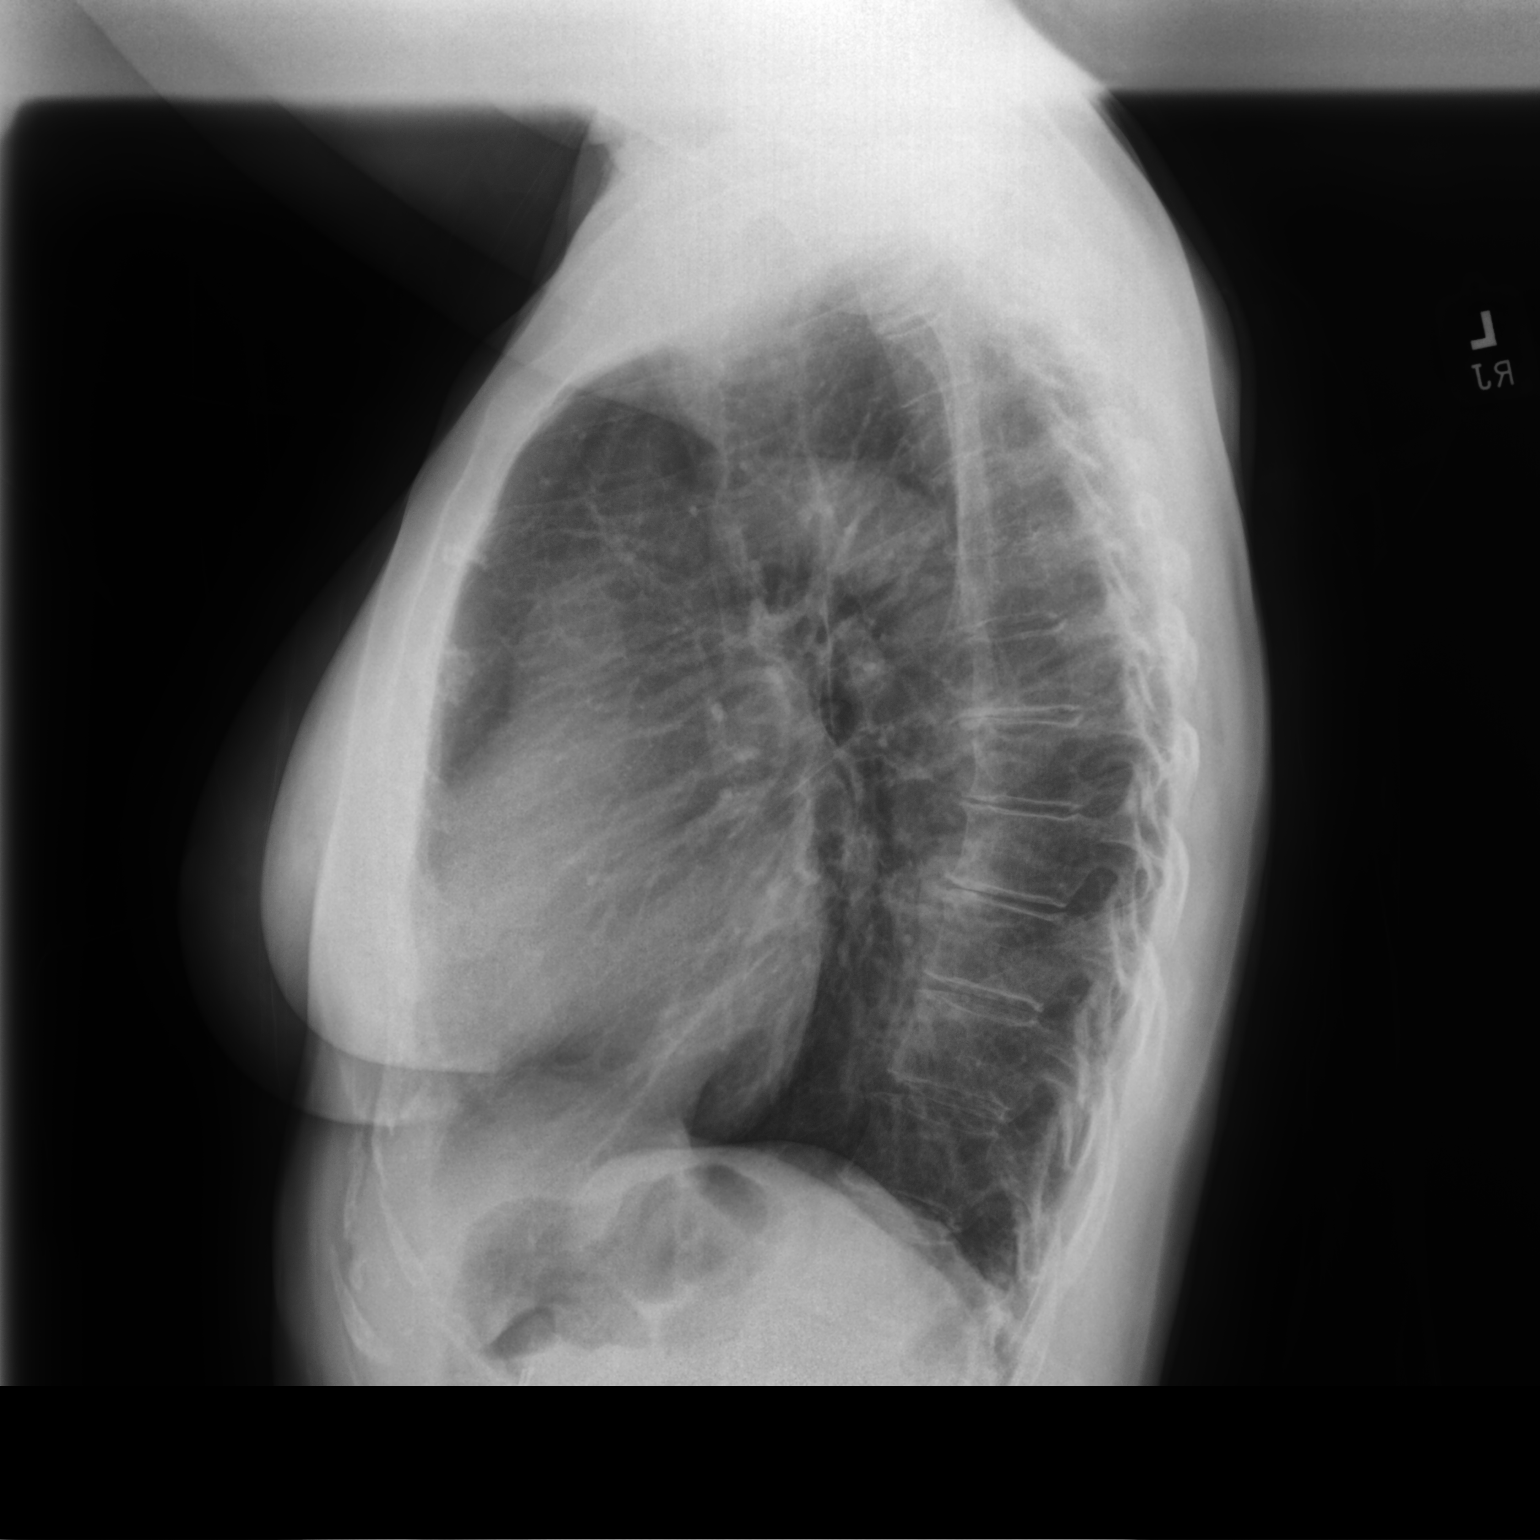

[2 of 2 positions shown; findings below may reference images not displayed]

FINDINGS: The heart size and mediastinal contours are within normal limits.
Both lungs are clear. Scoliosis of spine is noted.
IMPRESSION: No active cardiopulmonary disease.

## 2023-05-16 ENCOUNTER — Other Ambulatory Visit: Payer: Self-pay | Admitting: Internal Medicine

## 2023-05-16 ENCOUNTER — Other Ambulatory Visit (HOSPITAL_COMMUNITY): Payer: Self-pay

## 2023-05-16 DIAGNOSIS — R058 Other specified cough: Secondary | ICD-10-CM

## 2023-05-16 MED ORDER — CITALOPRAM HYDROBROMIDE 10 MG PO TABS
10.0000 mg | ORAL_TABLET | Freq: Every day | ORAL | 1 refills | Status: DC
  Filled 2023-05-16 – 2023-05-27 (×2): qty 90, 90d supply, fill #0
  Filled 2023-09-12 (×2): qty 90, 90d supply, fill #1

## 2023-05-27 ENCOUNTER — Other Ambulatory Visit (HOSPITAL_COMMUNITY): Payer: Self-pay

## 2023-05-27 ENCOUNTER — Other Ambulatory Visit: Payer: Self-pay

## 2023-05-29 ENCOUNTER — Telehealth: Payer: Self-pay | Admitting: Internal Medicine

## 2023-05-29 NOTE — Telephone Encounter (Signed)
Patient is calling for a medication refill on protonix and pepcid . Pharmacy states that she needs an appointment but she says she was told by Dr.Wert that she doesn't have to have an appointment. Please call and advise. She can be reached at [phone] [facility] [phone]

## 2023-05-31 ENCOUNTER — Other Ambulatory Visit (HOSPITAL_COMMUNITY): Payer: Self-pay

## 2023-06-03 ENCOUNTER — Other Ambulatory Visit: Payer: Self-pay | Admitting: Internal Medicine

## 2023-06-03 DIAGNOSIS — R058 Other specified cough: Secondary | ICD-10-CM

## 2023-06-04 NOTE — Telephone Encounter (Signed)
Pt. Calling back to get refills put wants to know if she really needs to sched. A apt. To get the meds cause Dr.Wert said everything is ok

## 2023-06-06 ENCOUNTER — Other Ambulatory Visit (HOSPITAL_COMMUNITY): Payer: Self-pay

## 2023-07-23 ENCOUNTER — Ambulatory Visit (INDEPENDENT_AMBULATORY_CARE_PROVIDER_SITE_OTHER): Payer: Self-pay | Admitting: Plastic Surgery

## 2023-07-23 DIAGNOSIS — Z719 Counseling, unspecified: Secondary | ICD-10-CM

## 2023-07-23 NOTE — Progress Notes (Signed)
Dysport and Filler Injection Procedure Note  Procedure: Cosmetic botulinum toxin and Filler administration  Pre-operative Diagnosis: Dynamic rhytides and midface volume loss  Post-operative Diagnosis: Same  Complications:  None  Brief history: The patient desires botulinum toxin injection of her forehead. I discussed with the patient this proposed procedure of botulinum toxin injections, which is customized depending on the particular needs of the patient. It is performed on facial rhytids as a temporary correction. The alternatives were discussed with the patient. The risks were addressed including bleeding, scarring, infection, damage to deeper structures, asymmetry, and chronic pain, which may occur infrequently after a procedure. The individual's choice to undergo a surgical procedure is based on the comparison of risks to potential benefits. Other risks include unsatisfactory results, brow ptosis, eyelid ptosis, allergic reaction, temporary paralysis, which should go away with time, bruising, blurring disturbances and delayed healing. Botulinum toxin injections do not arrest the aging process or produce permanent tightening of the eyelid.  Operative intervention maybe necessary to maintain the results of a blepharoplasty or botulinum toxin. The patient understands and wishes to proceed. Consent was given.  Procedure: The area was prepped with alcohol and dried with a clean gauze. Using a clean technique, the botulinum toxin was diluted with 3 cc of preservative-free normal saline which was slowly injected with an 18 gauge needle in a tuberculin syringes.  A 32 gauge needles were then used to inject the botulinum toxin. This mixture allow for an aliquot of 10 units per 0.1 cc in each injection site.    Subsequently the mixture was injected in the glabellar and forehead area with preservation of the temporal branch to the lateral eyebrow as well as into each lateral canthal area beginning from the  lateral orbital rim medial to the zygomaticus major in 3 separate areas. A total of 60 Units of botulinum toxin was used. The forehead and glabellar area was injected with care to inject intramuscular only while holding pressure on the supratrochlear vessels in each area during each injection on either side of the medial corrugators. The injection proceeded vertically superiorly to the medial 2/3 of the frontalis muscle and superior 2/3 of the lateral frontalis, again with preservation of the frontal branch.  No complications were noted. Light pressure was held for 5 minutes. She was instructed explicitly in post-operative care.  Dysport LOT:  [id number]

## 2023-08-01 DIAGNOSIS — H35412 Lattice degeneration of retina, left eye: Secondary | ICD-10-CM | POA: Diagnosis not present

## 2023-09-12 ENCOUNTER — Other Ambulatory Visit (HOSPITAL_COMMUNITY): Payer: Self-pay

## 2023-12-12 ENCOUNTER — Other Ambulatory Visit (HOSPITAL_COMMUNITY): Payer: Self-pay

## 2023-12-12 MED ORDER — CITALOPRAM HYDROBROMIDE 10 MG PO TABS
10.0000 mg | ORAL_TABLET | Freq: Every day | ORAL | 1 refills | Status: DC
  Filled 2023-12-12: qty 90, 90d supply, fill #0
  Filled 2024-03-16: qty 90, 90d supply, fill #1

## 2023-12-16 ENCOUNTER — Other Ambulatory Visit (HOSPITAL_COMMUNITY): Payer: Self-pay

## 2023-12-25 ENCOUNTER — Other Ambulatory Visit: Payer: Self-pay

## 2023-12-25 ENCOUNTER — Emergency Department (HOSPITAL_COMMUNITY)
Admission: EM | Admit: 2023-12-25 | Discharge: 2023-12-25 | Disposition: A | Discharge status: Home / Self Care | Attending: Emergency Medicine | Admitting: Emergency Medicine

## 2023-12-25 ENCOUNTER — Encounter (HOSPITAL_COMMUNITY): Payer: Self-pay

## 2023-12-25 ENCOUNTER — Emergency Department (HOSPITAL_COMMUNITY)

## 2023-12-25 DIAGNOSIS — Z79899 Other long term (current) drug therapy: Secondary | ICD-10-CM | POA: Diagnosis not present

## 2023-12-25 DIAGNOSIS — I700 Atherosclerosis of aorta: Secondary | ICD-10-CM | POA: Diagnosis not present

## 2023-12-25 DIAGNOSIS — S50812A Abrasion of left forearm, initial encounter: Secondary | ICD-10-CM | POA: Diagnosis not present

## 2023-12-25 DIAGNOSIS — I3139 Other pericardial effusion (noninflammatory): Secondary | ICD-10-CM | POA: Diagnosis not present

## 2023-12-25 DIAGNOSIS — S0081XA Abrasion of other part of head, initial encounter: Secondary | ICD-10-CM | POA: Diagnosis not present

## 2023-12-25 DIAGNOSIS — W228XXA Striking against or struck by other objects, initial encounter: Secondary | ICD-10-CM | POA: Insufficient documentation

## 2023-12-25 DIAGNOSIS — W19XXXA Unspecified fall, initial encounter: Secondary | ICD-10-CM

## 2023-12-25 DIAGNOSIS — S2232XA Fracture of one rib, left side, initial encounter for closed fracture: Secondary | ICD-10-CM | POA: Insufficient documentation

## 2023-12-25 DIAGNOSIS — J9811 Atelectasis: Secondary | ICD-10-CM | POA: Diagnosis not present

## 2023-12-25 MED ORDER — ACETAMINOPHEN 500 MG PO TABS
1000.0000 mg | ORAL_TABLET | Freq: Once | ORAL | Status: AC
  Administered 2023-12-25: 1000 mg via ORAL
  Filled 2023-12-25: qty 2

## 2023-12-25 MED ORDER — HYDROMORPHONE HCL 1 MG/ML IJ SOLN
1.0000 mg | Freq: Once | INTRAMUSCULAR | Status: AC
  Administered 2023-12-25: 1 mg via INTRAMUSCULAR
  Filled 2023-12-25: qty 1

## 2023-12-25 MED ORDER — OXYCODONE-ACETAMINOPHEN 5-325 MG PO TABS: 1.0000 | ORAL_TABLET | Freq: Four times a day (QID) | ORAL | 0 refills | Status: DC | PRN

## 2023-12-25 NOTE — Discharge Instructions (Signed)
 You have been evaluated for your fall.  CT scan today demonstrate left broken rib (6th rib) causing discomfort.  There are also some small amount of fluid around your heart.  Please take pain medication as prescribed.  Use incentive spirometer several times hourly for the next 4 days to decrease risk of developing pneumonia.  Follow-up with your doctor for further care and especially with cardiology for further managements of your pericardial effusion (fluid around the heart).  Return to the ER if you have any concern.

## 2023-12-25 NOTE — ED Provider Notes (Signed)
  EMERGENCY DEPARTMENT AT [facility] Provider Note   CSN: [id number] Arrival date & time: 12/25/23  1555     History  Chief Complaint  Patient presents with   Kathryn Morton is a 68 y.o. female.  The history is provided by the patient and medical records. No language interpreter was used.  Fall     68 year old female presenting for evaluation of a fall.  Patient report yesterday afternoon she was chasing after her dog while she was running down a hill when she lost control and ended up rolling forward on the ground.  She did struck her head as well as a chest but denies any loss of consciousness.  She was able to get up unassisted.  She did notice some abrasion to her left forearm and her forehead.  She was able to clean up the abrasions and apply some glue to her cut on her forehead.  She now endorses having pain to her chest wall.  Pain is sharp throbbing moderate in intensity improved with taking Motrin at home.  No shortness of breath no productive cough or hemoptysis denies any significant headache, neck pain, back pain or pain to her extremities.  She is not on any blood thinner medication.  She denies any hip pain knees pain.  She was able to ambulate.  She is unsure of her tetanus status.  Home Medications Prior to Admission medications   Medication Sig Start Date End Date Taking? Authorizing Provider  benzonatate (TESSALON) 200 MG capsule Take 1 capsule (200 mg total) by mouth 3 (three) times daily as needed for cough. 03/13/22   Nyoka Cowden, MD  buPROPion (WELLBUTRIN XL) 300 MG 24 hr tablet Take 1 tablet (300 mg total) by mouth in the morning. 02/27/23     celecoxib (CELEBREX) 200 MG capsule Take 1 capsule by mouth twice a day 02/06/23     citalopram (CELEXA) 10 MG tablet Take 1 tablet (10 mg total) by mouth daily as directed 12/12/23     citalopram (CELEXA) 20 MG tablet TAKE 1 TABLET BY MOUTH EVERY DAY 09/05/20 03/13/22  Zelphia Cairo, MD   famotidine (PEPCID) 20 MG tablet Take 1 tablet (20 mg total) by mouth daily after supper 03/13/22   Nyoka Cowden, MD  pantoprazole (PROTONIX) 40 MG tablet Take 1 tablet (40 mg total) by mouth daily. Take 30-60 minutes before first meal of the day. 02/14/23   Nyoka Cowden, MD      Allergies    Patient has no known allergies.    Review of Systems   Review of Systems  All other systems reviewed and are negative.   Physical Exam Updated Vital Signs BP (!) 133/98 (BP Location: Left Arm)   Pulse (!) 58   Temp 97.7 F (36.5 C) (Oral)   Resp 16   Ht 5\' 4"  (1.626 m)   Wt 56.7 kg   SpO2 100%   BMI 21.46 kg/m  Physical Exam Vitals and nursing note reviewed.  Constitutional:      General: She is not in acute distress.    Appearance: She is well-developed.     Comments: Patient resting comfortably appears to be in no acute discomfort.  HENT:     Head: Normocephalic.     Comments: Small abrasion noted to left forehead with Dermabond in place it is nontender to palpation no midface tenderness no raccoons eyes no Battle sign and no malocclusion Eyes:  Extraocular Movements: Extraocular movements intact.     Conjunctiva/sclera: Conjunctivae normal.     Pupils: Pupils are equal, round, and reactive to light.  Cardiovascular:     Rate and Rhythm: Normal rate and regular rhythm.     Pulses: Normal pulses.     Heart sounds: Normal heart sounds.  Pulmonary:     Effort: Pulmonary effort is normal.  Chest:     Chest wall: Tenderness (Tenderness noted to left anterior chest wall and lateral chest wall without any crepitus or emphysema no bruising noted.) present.  Abdominal:     Palpations: Abdomen is soft.     Tenderness: There is no abdominal tenderness.  Musculoskeletal:     Cervical back: Normal range of motion and neck supple. No tenderness.     Comments: 5 out of 5 strength to all 4 extremities  Skin:    Findings: No rash.     Comments: Abrasion noted to left forearm  nontender to palpation.  Neurological:     Mental Status: She is alert and oriented to person, place, and time.  Psychiatric:        Mood and Affect: Mood normal.     ED Results / Procedures / Treatments   Labs (all labs ordered are listed, but only abnormal results are displayed) Labs Reviewed - No data to display  EKG EKG Interpretation Date/Time:  Wednesday December 25 2023 17:58:53 EDT Ventricular Rate:  50 PR Interval:  190 QRS Duration:  94 QT Interval:  427 QTC Calculation: 390 R Axis:   49  Text Interpretation: Sinus bradycardia Confirmed by Cathren Laine ([id number]) on 12/25/2023 6:05:57 PM  Radiology CT Chest Wo Contrast Result Date: 12/25/2023 CLINICAL DATA:  Blunt chest trauma.  Fall yesterday. EXAM: CT CHEST WITHOUT CONTRAST TECHNIQUE: Multidetector CT imaging of the chest was performed following the standard protocol without IV contrast. RADIATION DOSE REDUCTION: This exam was performed according to the departmental dose-optimization program which includes automated exposure control, adjustment of the mA and/or kV according to patient size and/or use of iterative reconstruction technique. COMPARISON:  Radiographs 03/13/2022, no interval imaging available. FINDINGS: Cardiovascular: Assessment for vascular injury is limited in the absence of IV contrast. There is no periaortic stranding. Trace aortic atherosclerosis. The heart is upper normal in size. Small pericardial effusion measuring simple density. Mediastinum/Nodes: No mediastinal hemorrhage or hematoma. No bulky adenopathy. No pneumomediastinum. No esophageal wall thickening. Lungs/Pleura: No pneumothorax or pulmonary contusion. Scattered linear atelectasis in the lingula and both lower lobes. No significant pleural effusion. Trachea and central airways are clear. Upper Abdomen: No acute findings in the included upper abdomen. No free fluid. Musculoskeletal: Minimally displaced left anterior sixth rib fracture. No associated  chest wall contusion. No additional fracture. Slight scoliotic curvature in the thoracic spine with mild degenerative change. Bilateral breast implants. IMPRESSION: 1. Minimally displaced left anterior sixth rib fracture. No associated chest wall contusion or pneumothorax. 2. Small pericardial effusion. Aortic Atherosclerosis (ICD10-I70.0). Electronically Signed   By: Narda Rutherford M.D.   On: 12/25/2023 17:46    Procedures Procedures    Medications Ordered in ED Medications  HYDROmorphone (DILAUDID) injection 1 mg (has no administration in time range)  acetaminophen (TYLENOL) tablet 1,000 mg (1,000 mg Oral Given 12/25/23 1755)    ED Course/ Medical Decision Making/ A&P                                 Medical Decision Making  Amount and/or Complexity of Data Reviewed Radiology: ordered. ECG/medicine tests: ordered.  Risk OTC drugs. Prescription drug management.   BP (!) 133/98 (BP Location: Left Arm)   Pulse (!) 58   Temp 97.7 F (36.5 C) (Oral)   Resp 16   Ht 5\' 4"  (1.626 m)   Wt 56.7 kg   SpO2 100%   BMI 21.46 kg/m   46:51 PM  68 year old female presenting for evaluation of a fall.  Patient report yesterday afternoon she was chasing after her dog while she was running down a hill when she lost control and ended up rolling forward on the ground.  She did struck her head as well as a chest but denies any loss of consciousness.  She was able to get up unassisted.  She did notice some abrasion to her left forearm and her forehead.  She was able to clean up the abrasions and apply some glue to her cut on her forehead.  She now endorses having pain to her chest wall.  Pain is sharp throbbing moderate in intensity improved with taking Motrin at home.  No shortness of breath no productive cough or hemoptysis denies any significant headache, neck pain, back pain or pain to her extremities.  She is not on any blood thinner medication.  She denies any hip pain knees pain.  She was able  to ambulate.  She is unsure of her tetanus status.  Exam notable for chest wall tenderness on palpation.  Abrasion to left forearm and a small cut noted to forehead not requiring laceration repair.  Patient overall well-appearing moving all 4 extremities without difficulty she is mentating appropriately.  Abdomen is soft nontender.  No midline spine tenderness.  EKG obtained without any concerning feature that is sinus bradycardia.  CT scan of the chest obtained independent viewed interpreted by me which demonstrate a minimally displaced left anterior sixth rib fracture without any chest wall contusion or pneumothorax.  This finding is consistent with patient presentation.  She also had a small pericardial effusion.  It is unclear if this is related to her recent injury or if it has been there previously.  I discussed finding with patient and recommend outpatient follow-up with cardiology for further evaluation.  Patient voiced understanding and agrees with plan.  Patient report improvement of symptoms after increasing pain medication.  DDx: Strain, sprain, fracture, dislocation, contusion, abrasion  Hospital admission considered but patient overall well-appearing and is stable for discharge.  I will provide patient with definitive rib fracture care which include incentive spirometer and pain management.         Final Clinical Impression(s) / ED Diagnoses Final diagnoses:  Closed fracture of one rib of left side, initial encounter  Pericardial effusion  Fall, initial encounter    Rx / DC Orders ED Discharge Orders          Ordered    oxyCODONE-acetaminophen (PERCOCET) 5-325 MG tablet  Every 6 hours PRN        12/25/23 1847              Fayrene Helper, PA-C 12/25/23 1850    Cathren Laine, MD 12/25/23 2118

## 2023-12-25 NOTE — ED Triage Notes (Signed)
 Pt fell yesterday, scattered abrasions, chest pain, back pain.

## 2024-02-13 ENCOUNTER — Ambulatory Visit (INDEPENDENT_AMBULATORY_CARE_PROVIDER_SITE_OTHER): Payer: Self-pay | Admitting: Plastic Surgery

## 2024-02-13 DIAGNOSIS — Z719 Counseling, unspecified: Secondary | ICD-10-CM

## 2024-02-13 NOTE — Progress Notes (Signed)

## 2024-03-04 DIAGNOSIS — K219 Gastro-esophageal reflux disease without esophagitis: Secondary | ICD-10-CM | POA: Diagnosis not present

## 2024-03-04 DIAGNOSIS — E785 Hyperlipidemia, unspecified: Secondary | ICD-10-CM | POA: Diagnosis not present

## 2024-03-04 DIAGNOSIS — M810 Age-related osteoporosis without current pathological fracture: Secondary | ICD-10-CM | POA: Diagnosis not present

## 2024-03-12 ENCOUNTER — Other Ambulatory Visit (HOSPITAL_COMMUNITY): Payer: Self-pay

## 2024-03-12 DIAGNOSIS — Z6822 Body mass index (BMI) 22.0-22.9, adult: Secondary | ICD-10-CM | POA: Diagnosis not present

## 2024-03-12 DIAGNOSIS — Z124 Encounter for screening for malignant neoplasm of cervix: Secondary | ICD-10-CM | POA: Diagnosis not present

## 2024-03-12 DIAGNOSIS — Z1231 Encounter for screening mammogram for malignant neoplasm of breast: Secondary | ICD-10-CM | POA: Diagnosis not present

## 2024-03-12 DIAGNOSIS — Z01419 Encounter for gynecological examination (general) (routine) without abnormal findings: Secondary | ICD-10-CM | POA: Diagnosis not present

## 2024-03-12 DIAGNOSIS — Z1151 Encounter for screening for human papillomavirus (HPV): Secondary | ICD-10-CM | POA: Diagnosis not present

## 2024-03-12 MED ORDER — CITALOPRAM HYDROBROMIDE 10 MG PO TABS
10.0000 mg | ORAL_TABLET | Freq: Every day | ORAL | 3 refills | Status: AC
  Filled 2024-03-12: qty 90, 90d supply, fill #0
  Filled 2024-06-15: qty 90, 90d supply, fill #1
  Filled 2024-09-16: qty 90, 90d supply, fill #2
  Filled ????-??-??: fill #1

## 2024-03-12 MED ORDER — BUPROPION HCL ER (XL) 300 MG PO TB24
300.0000 mg | ORAL_TABLET | Freq: Every morning | ORAL | 3 refills | Status: AC
  Filled 2024-03-12: qty 90, 90d supply, fill #0
  Filled 2024-06-15: qty 90, 90d supply, fill #1
  Filled 2024-09-16: qty 90, 90d supply, fill #2
  Filled ????-??-??: fill #1

## 2024-03-16 ENCOUNTER — Other Ambulatory Visit (HOSPITAL_COMMUNITY): Payer: Self-pay

## 2024-05-26 ENCOUNTER — Ambulatory Visit (INDEPENDENT_AMBULATORY_CARE_PROVIDER_SITE_OTHER): Payer: Self-pay | Admitting: Plastic Surgery

## 2024-05-26 ENCOUNTER — Encounter: Payer: Self-pay | Admitting: Plastic Surgery

## 2024-05-26 DIAGNOSIS — Z719 Counseling, unspecified: Secondary | ICD-10-CM

## 2024-05-26 NOTE — Progress Notes (Signed)
 Filler Injection Procedure Note  Procedure:  Filler administration  Pre-operative Diagnosis: Rytides   Post-operative Diagnosis: Same  Surgeon: Electronically signed by: Estefana Reichert, DO   Complications:  None  Brief history: The patient desires injection with fillers in her face. I discussed with the patient this proposed procedure of filler injections, which is customized depending on the particular needs of the patient. It is performed on facial volume loss as a temporary correction. The alternatives were discussed with the patient. The risks were addressed including bleeding, scarring, infection, damage to deeper structures, asymmetry, and chronic pain, which may occur infrequently after a procedure. The individual's choice to undergo a surgical procedure is based on the comparison of risks to potential benefits. Other risks include unsatisfactory results, allergic reaction, which should go away with time, bruising and delayed healing. Fillers do not arrest the aging process or produce permanent tightening.  Operative intervention maybe necessary to maintain the results. The patient understands and wishes to proceed. An informed consent was signed and informational brochures given to her prior to the procedure.  Procedure: The area was prepped with chlorhexidine and dried with a clean gauze. Using a clean technique, a 30 gauge needle was then used to inject the filler into the upper and lower lips. This was done with one syringe.   No complications were noted. Light pressure was held for 5 minutes. She was instructed explicitly in post-operative care.  Kysse LOT: [id number]

## 2024-06-15 ENCOUNTER — Other Ambulatory Visit (HOSPITAL_COMMUNITY): Payer: Self-pay

## 2024-07-07 ENCOUNTER — Ambulatory Visit: Payer: Self-pay | Admitting: Plastic Surgery

## 2024-07-07 DIAGNOSIS — L819 Disorder of pigmentation, unspecified: Secondary | ICD-10-CM | POA: Insufficient documentation

## 2024-07-07 DIAGNOSIS — L989 Disorder of the skin and subcutaneous tissue, unspecified: Secondary | ICD-10-CM | POA: Insufficient documentation

## 2024-07-07 DIAGNOSIS — Z719 Counseling, unspecified: Secondary | ICD-10-CM

## 2024-07-07 NOTE — Progress Notes (Signed)
   Subjective:    Patient ID: Kathryn Morton Fees, female    DOB: 1955-11-14, 68 y.o.   MRN: [id number]  Patient is a 68 year old female here for evaluation of a skin lesion on her left thorax.  This has been getting quite a bit larger over the last several years.  It is now starting to bleed and get quite irritated.  She is a Kathren 1 so there is certainly concern of skin cancer.  Nothing seems to be making it better and this is a bit different than her other skin hyperpigmented lesions.      Review of Systems  Constitutional: Negative.   Eyes: Negative.   Respiratory: Negative.    Cardiovascular: Negative.   Gastrointestinal: Negative.   Endocrine: Negative.   Genitourinary: Negative.   Musculoskeletal: Negative.        Objective:   Physical Exam Vitals reviewed.  Constitutional:      Appearance: Normal appearance.  HENT:     Head: Atraumatic.  Cardiovascular:     Rate and Rhythm: Normal rate.     Pulses: Normal pulses.  Pulmonary:     Effort: Pulmonary effort is normal.  Skin:    General: Skin is warm.     Capillary Refill: Capillary refill takes less than 2 seconds.     Findings: Lesion present.  Neurological:     Mental Status: She is alert and oriented to person, place, and time.  Psychiatric:        Mood and Affect: Mood normal.        Behavior: Behavior normal.        Thought Content: Thought content normal.        Judgment: Judgment normal.           Assessment & Plan:     ICD-10-CM   1. Encounter for counseling  Z71.9        Plan for excision changing skin lesion left thorax.

## 2024-07-07 NOTE — Addendum Note (Signed)
 Addended by: LOWERY ESTEFANA RAMAN on: 07/07/2024 12:53 PM   Modules accepted: Orders

## 2024-07-07 NOTE — Progress Notes (Signed)
 Preoperative Dx: hyperpigmentation of face  Postoperative Dx:  same  Procedure: laser to face and legs   Anesthesia: none  Description of Procedure:  Risks and complications were explained to the patient. Consent was confirmed and signed. Eye protection was placed. Time out was called and all information was confirmed to be correct. The area  area was prepped with alcohol and wiped dry. The heroic laser was set at 515 nm and preset J/cm2. The face and leg was lasered. The patient tolerated the procedure well and there were no complications. The patient is to follow up in 4 weeks.

## 2024-08-17 DIAGNOSIS — M810 Age-related osteoporosis without current pathological fracture: Secondary | ICD-10-CM | POA: Diagnosis not present

## 2024-08-27 ENCOUNTER — Other Ambulatory Visit (INDEPENDENT_AMBULATORY_CARE_PROVIDER_SITE_OTHER): Payer: Self-pay

## 2024-08-27 ENCOUNTER — Ambulatory Visit: Admitting: Orthopaedic Surgery

## 2024-08-27 ENCOUNTER — Other Ambulatory Visit (HOSPITAL_BASED_OUTPATIENT_CLINIC_OR_DEPARTMENT_OTHER): Payer: Self-pay

## 2024-08-27 ENCOUNTER — Encounter: Payer: Self-pay | Admitting: Orthopaedic Surgery

## 2024-08-27 DIAGNOSIS — M25552 Pain in left hip: Secondary | ICD-10-CM | POA: Diagnosis not present

## 2024-08-27 DIAGNOSIS — M7062 Trochanteric bursitis, left hip: Secondary | ICD-10-CM

## 2024-08-27 MED ORDER — METHYLPREDNISOLONE ACETATE 40 MG/ML IJ SUSP
40.0000 mg | INTRAMUSCULAR | Status: AC | PRN
Start: 2024-08-27 — End: 2024-08-27
  Administered 2024-08-27: 40 mg via INTRA_ARTICULAR

## 2024-08-27 MED ORDER — LIDOCAINE HCL 1 % IJ SOLN
3.0000 mL | INTRAMUSCULAR | Status: AC | PRN
Start: 2024-08-27 — End: 2024-08-27
  Administered 2024-08-27: 3 mL

## 2024-08-27 NOTE — Progress Notes (Signed)
 Kathryn Morton is a very active 68 year old female though we know very well from the [facility] in [place].  She works with the heart service and vascular service.  She has been dealing with left hip pain for some time now but is gotten worse recently.  She has not had any specific injury and occasion was having some groin pain but most of her pain is on the lateral aspect of her left hip and she cannot sleep on that side at night and she is a side sleeper and her left side is her favorite side to sleep all.  She has also been limping when she first gets up and sometimes a pinching sensation.  She has never had surgery on her left hip or her right hip.  She denies any weakness in her legs and denies any numbness and ting in her feet.  On exam she is a thin and active individual.  When we have her lay down flat her leg lengths are entirely equal.  Her left hip and right hip have full and fluid range of motion with no blocks to rotation and no pain in the groin today at all.  When we have her lay on her right side of her left hip up there is significant pain around the tip of the greater trochanter and the proximal IT band.  Standing AP pelvis and lateral of the left hip shows normal-appearing hips bilaterally and no cortical irregularities around the trochanteric area and the joint space is well-maintained.  Her signs and symptoms seem to be consistent more with trochanteric tendinitis and bursitis.  I did recommend a steroid injection over the point of maximum tenderness of the left hip area and she agreed to this and tolerated well.  She will also try Voltaren gel 2-3 times daily over this area and avoid squats and lunges.  Since we know her from the operating room well she will keep me updated and we are in surgery and if this is not getting better we would then obtain an MRI of her left hip to rule out any type of tears of the gluteus medius and minimus tendon.  She understands this fully as well.    Procedure  Note  Patient: Kathryn Morton             Date of Birth: 10-05-1956           MRN: [id number]             Visit Date: 08/27/2024  Procedures: Visit Diagnoses:  1. Pain in left hip   2. Trochanteric bursitis, left hip     Large Joint Inj: L greater trochanter on 08/27/2024 4:48 PM Indications: pain and diagnostic evaluation Details: 22 G 1.5 in needle, lateral approach  Arthrogram: No  Medications: 3 mL lidocaine  1 %; 40 mg methylPREDNISolone  acetate 40 MG/ML Outcome: tolerated well, no immediate complications Procedure, treatment alternatives, risks and benefits explained, specific risks discussed. Consent was given by the patient. Immediately prior to procedure a time out was called to verify the correct patient, procedure, equipment, support staff and site/side marked as required. Patient was prepped and draped in the usual sterile fashion.

## 2024-09-11 DIAGNOSIS — M810 Age-related osteoporosis without current pathological fracture: Secondary | ICD-10-CM | POA: Diagnosis not present

## 2024-09-14 ENCOUNTER — Ambulatory Visit: Admitting: Plastic Surgery

## 2024-09-14 DIAGNOSIS — L989 Disorder of the skin and subcutaneous tissue, unspecified: Secondary | ICD-10-CM

## 2024-09-14 NOTE — Progress Notes (Signed)
 Procedure Note  Preoperative Dx: changing skin lesion  Postoperative Dx: Same  Procedure: excision of changing skin lesion of thorax 9 mm  Anesthesia: Lidocaine  1% with 1:100,000 epinephrine  Indication for Procedure: skin lesion  Description of Procedure: Risks and complications were explained to the patient.  Consent was confirmed and the patient understands the risks and benefits.  The potential complications and alternatives were explained and the patient consents.  The patient expressed understanding the option of not having the procedure and the risks of a scar.  Time out was called and all information was confirmed to be correct.    The area was prepped and drapped.  Lidocaine  1% with epinephrine was injected in the subcutaneous area.  After waiting several minutes for the local to take affect a #15 blade was used to excise the area in an eliptical pattern.The skin edges were reapproximated with 5-0 Monocryl subcuticular running closure.  A dressing was applied.  The patient was given instructions on how to care for the area and a follow up appointment.  Kathryn Morton tolerated the procedure well and there were no complications.

## 2024-09-16 ENCOUNTER — Other Ambulatory Visit (HOSPITAL_COMMUNITY): Payer: Self-pay

## 2024-10-05 ENCOUNTER — Other Ambulatory Visit (HOSPITAL_COMMUNITY): Payer: Self-pay
